# Patient Record
Sex: Female | Born: 1950 | Race: White | Hispanic: No | Marital: Married | Smoking: Never smoker
Health system: Southern US, Community
[De-identification: ages and names within clinical notes are randomized; demographics above are authoritative.]

## PROBLEM LIST (undated history)

## (undated) DIAGNOSIS — F419 Anxiety disorder, unspecified: Secondary | ICD-10-CM

## (undated) DIAGNOSIS — I428 Other cardiomyopathies: Secondary | ICD-10-CM

## (undated) DIAGNOSIS — D219 Benign neoplasm of connective and other soft tissue, unspecified: Secondary | ICD-10-CM

## (undated) DIAGNOSIS — I10 Essential (primary) hypertension: Secondary | ICD-10-CM

## (undated) DIAGNOSIS — R55 Syncope and collapse: Secondary | ICD-10-CM

## (undated) DIAGNOSIS — Z950 Presence of cardiac pacemaker: Secondary | ICD-10-CM

## (undated) DIAGNOSIS — N938 Other specified abnormal uterine and vaginal bleeding: Secondary | ICD-10-CM

## (undated) DIAGNOSIS — H269 Unspecified cataract: Secondary | ICD-10-CM

## (undated) DIAGNOSIS — I442 Atrioventricular block, complete: Secondary | ICD-10-CM

## (undated) HISTORY — DX: Anxiety disorder, unspecified: F41.9

## (undated) HISTORY — DX: Other specified abnormal uterine and vaginal bleeding: N93.8

## (undated) HISTORY — DX: Unspecified cataract: H26.9

## (undated) HISTORY — DX: Atrioventricular block, complete: I44.2

## (undated) HISTORY — DX: Syncope and collapse: R55

## (undated) HISTORY — DX: Benign neoplasm of connective and other soft tissue, unspecified: D21.9

## (undated) HISTORY — DX: Other cardiomyopathies: I42.8

## (undated) HISTORY — DX: Presence of cardiac pacemaker: Z95.0

## (undated) HISTORY — PX: INSERT / REPLACE / REMOVE PACEMAKER: SUR710

---

## 2000-10-22 ENCOUNTER — Other Ambulatory Visit: Admission: RE | Admit: 2000-10-22 | Discharge: 2000-10-22 | Payer: Self-pay | Admitting: Gynecology

## 2000-11-03 ENCOUNTER — Other Ambulatory Visit: Admission: RE | Admit: 2000-11-03 | Discharge: 2000-11-03 | Payer: Self-pay | Admitting: Obstetrics and Gynecology

## 2000-11-03 ENCOUNTER — Encounter (INDEPENDENT_AMBULATORY_CARE_PROVIDER_SITE_OTHER): Payer: Self-pay

## 2001-05-22 ENCOUNTER — Ambulatory Visit (HOSPITAL_COMMUNITY): Admission: RE | Admit: 2001-05-22 | Discharge: 2001-05-22 | Payer: Self-pay | Admitting: Obstetrics and Gynecology

## 2001-07-06 ENCOUNTER — Inpatient Hospital Stay (HOSPITAL_COMMUNITY): Admission: RE | Admit: 2001-07-06 | Discharge: 2001-07-07 | Payer: Self-pay | Admitting: Obstetrics and Gynecology

## 2001-07-06 ENCOUNTER — Encounter (INDEPENDENT_AMBULATORY_CARE_PROVIDER_SITE_OTHER): Payer: Self-pay | Admitting: Specialist

## 2001-07-06 HISTORY — PX: VAGINAL HYSTERECTOMY: SUR661

## 2003-01-03 ENCOUNTER — Other Ambulatory Visit: Admission: RE | Admit: 2003-01-03 | Discharge: 2003-01-03 | Payer: Self-pay | Admitting: Obstetrics and Gynecology

## 2004-01-06 ENCOUNTER — Other Ambulatory Visit: Admission: RE | Admit: 2004-01-06 | Discharge: 2004-01-06 | Payer: Self-pay | Admitting: Obstetrics and Gynecology

## 2005-01-21 ENCOUNTER — Other Ambulatory Visit: Admission: RE | Admit: 2005-01-21 | Discharge: 2005-01-21 | Payer: Self-pay | Admitting: Obstetrics and Gynecology

## 2005-10-28 HISTORY — PX: COLONOSCOPY: SHX174

## 2006-01-22 ENCOUNTER — Other Ambulatory Visit: Admission: RE | Admit: 2006-01-22 | Discharge: 2006-01-22 | Payer: Self-pay | Admitting: Obstetrics and Gynecology

## 2006-02-17 ENCOUNTER — Ambulatory Visit: Payer: Self-pay | Admitting: Gastroenterology

## 2006-03-03 ENCOUNTER — Ambulatory Visit: Payer: Self-pay | Admitting: Gastroenterology

## 2007-02-04 ENCOUNTER — Other Ambulatory Visit: Admission: RE | Admit: 2007-02-04 | Discharge: 2007-02-04 | Payer: Self-pay | Admitting: Obstetrics and Gynecology

## 2008-02-11 ENCOUNTER — Other Ambulatory Visit: Admission: RE | Admit: 2008-02-11 | Discharge: 2008-02-11 | Payer: Self-pay | Admitting: Obstetrics and Gynecology

## 2008-10-28 HISTORY — PX: PACEMAKER INSERTION: SHX728

## 2009-02-13 ENCOUNTER — Encounter: Payer: Self-pay | Admitting: Obstetrics and Gynecology

## 2009-02-13 ENCOUNTER — Other Ambulatory Visit: Admission: RE | Admit: 2009-02-13 | Discharge: 2009-02-13 | Payer: Self-pay | Admitting: Obstetrics and Gynecology

## 2009-02-13 ENCOUNTER — Ambulatory Visit: Payer: Self-pay | Admitting: Obstetrics and Gynecology

## 2009-04-26 ENCOUNTER — Ambulatory Visit: Payer: Self-pay | Admitting: Internal Medicine

## 2009-04-26 ENCOUNTER — Inpatient Hospital Stay (HOSPITAL_COMMUNITY): Admission: EM | Admit: 2009-04-26 | Discharge: 2009-04-29 | Payer: Self-pay | Admitting: Emergency Medicine

## 2009-04-27 ENCOUNTER — Encounter (INDEPENDENT_AMBULATORY_CARE_PROVIDER_SITE_OTHER): Payer: Self-pay | Admitting: Cardiovascular Disease

## 2009-04-28 ENCOUNTER — Encounter: Payer: Self-pay | Admitting: Internal Medicine

## 2009-04-29 ENCOUNTER — Encounter: Payer: Self-pay | Admitting: Internal Medicine

## 2009-05-02 ENCOUNTER — Telehealth: Payer: Self-pay | Admitting: Internal Medicine

## 2009-05-03 ENCOUNTER — Encounter: Payer: Self-pay | Admitting: Internal Medicine

## 2009-05-11 ENCOUNTER — Ambulatory Visit: Payer: Self-pay

## 2009-05-11 ENCOUNTER — Encounter: Payer: Self-pay | Admitting: Internal Medicine

## 2009-05-22 ENCOUNTER — Telehealth: Payer: Self-pay | Admitting: Internal Medicine

## 2009-08-02 ENCOUNTER — Ambulatory Visit: Payer: Self-pay | Admitting: Internal Medicine

## 2009-08-02 DIAGNOSIS — R55 Syncope and collapse: Secondary | ICD-10-CM | POA: Insufficient documentation

## 2009-08-02 DIAGNOSIS — F411 Generalized anxiety disorder: Secondary | ICD-10-CM | POA: Insufficient documentation

## 2009-08-02 DIAGNOSIS — I442 Atrioventricular block, complete: Secondary | ICD-10-CM | POA: Insufficient documentation

## 2009-08-02 DIAGNOSIS — I428 Other cardiomyopathies: Secondary | ICD-10-CM | POA: Insufficient documentation

## 2009-08-03 ENCOUNTER — Telehealth: Payer: Self-pay | Admitting: Internal Medicine

## 2009-09-05 ENCOUNTER — Ambulatory Visit: Payer: Self-pay | Admitting: Cardiology

## 2009-10-04 ENCOUNTER — Ambulatory Visit: Payer: Self-pay | Admitting: Cardiology

## 2009-11-14 ENCOUNTER — Telehealth: Payer: Self-pay | Admitting: Internal Medicine

## 2009-12-04 ENCOUNTER — Telehealth: Payer: Self-pay | Admitting: Internal Medicine

## 2010-02-14 ENCOUNTER — Other Ambulatory Visit: Admission: RE | Admit: 2010-02-14 | Discharge: 2010-02-14 | Payer: Self-pay | Admitting: Obstetrics and Gynecology

## 2010-02-14 ENCOUNTER — Ambulatory Visit: Payer: Self-pay | Admitting: Obstetrics and Gynecology

## 2010-02-19 ENCOUNTER — Telehealth (INDEPENDENT_AMBULATORY_CARE_PROVIDER_SITE_OTHER): Payer: Self-pay | Admitting: *Deleted

## 2010-07-03 ENCOUNTER — Ambulatory Visit: Payer: Self-pay | Admitting: Internal Medicine

## 2010-07-09 ENCOUNTER — Telehealth: Payer: Self-pay | Admitting: Internal Medicine

## 2010-07-27 ENCOUNTER — Encounter: Payer: Self-pay | Admitting: Internal Medicine

## 2010-07-27 ENCOUNTER — Ambulatory Visit: Payer: Self-pay | Admitting: Internal Medicine

## 2010-07-27 ENCOUNTER — Ambulatory Visit (HOSPITAL_COMMUNITY): Admission: RE | Admit: 2010-07-27 | Discharge: 2010-07-27 | Payer: Self-pay | Admitting: Internal Medicine

## 2010-07-30 LAB — CONVERTED CEMR LAB
CO2: 25 meq/L (ref 19–32)
Calcium: 8.7 mg/dL (ref 8.4–10.5)
Chloride: 106 meq/L (ref 96–112)
Glucose, Bld: 106 mg/dL — ABNORMAL HIGH (ref 70–99)
Sodium: 141 meq/L (ref 135–145)

## 2010-08-02 ENCOUNTER — Telehealth: Payer: Self-pay | Admitting: Internal Medicine

## 2010-08-08 ENCOUNTER — Telehealth: Payer: Self-pay | Admitting: Internal Medicine

## 2010-08-17 ENCOUNTER — Telehealth: Payer: Self-pay | Admitting: Internal Medicine

## 2010-08-22 ENCOUNTER — Telehealth: Payer: Self-pay | Admitting: Internal Medicine

## 2010-11-21 ENCOUNTER — Telehealth (INDEPENDENT_AMBULATORY_CARE_PROVIDER_SITE_OTHER): Payer: Self-pay | Admitting: *Deleted

## 2010-11-27 NOTE — Progress Notes (Signed)
Summary: [facility] [place]** dentist appt  Phone Note Call from Patient Call back at [phone]   Caller: Patient Reason for Call: Talk to Nurse Summary of Call: has dentist appt on saturday, do she need to be premedicated? Initial call taken by: Migdalia Dk,  December 04, 2009 2:51 PM  Follow-up for Phone Call        Her dentist is not sure if she needs pre-medication and which drugs he can give her and which ones he cannot with her pacemaker. I will s/w Dr. Graciela Husbands tomorrow.  Follow-up by: Duncan Dull, RN, BSN,  December 04, 2009 4:49 PM  Additional Follow-up for Phone Call Additional follow up Details #1::        antibiotics are not needed for dental worki with a pacemaker Additional Follow-up by: Nathen May, MD, [facility],  December 05, 2009 4:14 PM

## 2010-11-27 NOTE — Progress Notes (Signed)
Summary: question about labs  Phone Note Call from Patient Call back at Work Phone [phone] Call back at [phone]   Summary of Call: Pt have question about labs Initial call taken by: Judie Grieve,  August 02, 2010 2:03 PM  Follow-up for Phone Call        08/02/10--1445pm--pt calling asking why she was put on crestor and does she need to continue--advised that last lipid panel drawn 04/27/09 at [place]--results showed elevated LDL and i explained that was probably reason she was put on crestor--she states she thinks it needs to be drawn again and i advised her to go to PCP and have results faxed to dr klein--pt agrees--nt Follow-up by: Ledon Snare, RN,  August 02, 2010 2:52 PM     Appended Document: question about labs Spoke to patient 10/6 at 2pm. She was requesting copies of lab work from the physician (not [place])  that placed her on Crestor. She agreed to pursue her PCP for the results.

## 2010-11-27 NOTE — Progress Notes (Signed)
Summary: why should she take asa/ [facility] for call back/jr  Phone Note Call from Patient Call back at Home Phone [phone] Call back at [phone]   Caller: Patient Reason for Call: Talk to Nurse Details for Reason: pt wants to know why she's taken asa. 81 mg.  Initial call taken by: Lorne Skeens,  August 22, 2010 8:16 AM  Follow-up for Phone Call        [facility] for call back at home phone. Cell phone not in working order...can't leave a message.  Layne Benton, RN, BSN  August 22, 2010 8:50 AM  pt returned call-questioning if she is to continue antric aspirin, she has to pay this out of pocket, nad not sure that she needs it-pls call [phone] Glynda Jaeger  August 22, 2010 9:04 AM   Additional Follow-up for Phone Call Additional follow up Details #1::        Called patient back again and advised that we will ask Dr.Klein tomorrow if she needs to stay on Asa 81 mg and call her back.  Layne Benton, RN, BSN  August 22, 2010 11:22 AM  pt returning call Judie Grieve  August 23, 2010 8:53 AM    Additional Follow-up for Phone Call Additional follow up Details #2::    Discuused with Dr Graciela Husbands about 81mg  ASA and there is no benefit for her with NICM in taking the 81mg  ASA.  Pt aware and will stop ASA81mg  daily and she is also working on her diet. Dennis Bast, RN, BSN  August 23, 2010 10:55 AM

## 2010-11-27 NOTE — Cardiovascular Report (Signed)
Summary: Office Visit   Office Visit   Imported By: Roderic Ovens 07/10/2010 11:27:10  _____________________________________________________________________  External Attachment:    Type:   Image     Comment:   External Document

## 2010-11-27 NOTE — Progress Notes (Signed)
Summary: QUESTION HOW TO LOWER LDL  Phone Note Call from Patient Call back at [phone]   Caller: Patient Reason for Call: Talk to Nurse Summary of Call: QUESTION WHAT TO EAT AND TO TAKE TO LOWER LDL Initial call taken by: Roe Coombs,  August 08, 2010 10:35 AM  Follow-up for Phone Call        discussed options w/pt to lower cholesterol and will send menu options to pt to assist w/lower cholesterol.  Follow-up by: Claris Gladden RN,  August 08, 2010 12:27 PM

## 2010-11-27 NOTE — Progress Notes (Signed)
  ROI Signed....02/15/2010   Appended Document:  Faxed last 4 Office Notes over to [facility] to [employer] [phone]

## 2010-11-27 NOTE — Progress Notes (Signed)
Summary: refill  Phone Note Refill Request Call back at [phone] Message from:  Patient  Refills Requested: Medication #1:  RAMIPRIL 2.5 MG CAPS one by mouth daily   Supply Requested: 3 months  Medication #2:  CRESTOR 10 MG TABS Take one tablet by mouth daily.   Supply Requested: 3 months  Medication #3:  CARVEDILOL 12.5 MG TABS Take one tablet by mouth twice a day   [facility]ed[facility] [address] Needs 90 day supply   Method Requested: Fax to Local Pharmacy Initial call taken by: Migdalia Dk,  November 14, 2009 9:37 AM  Follow-up for Phone Call        pt wanted to talk to someone about the refills..she has already called pharm. Omer Jack  November 14, 2009 9:56 AM  please call pt once done, Migdalia Dk  November 14, 2009 10:43 AM    Additional Follow-up for Phone Call Additional follow up Details #1::        [facility] [address]  Additional Follow-up by: Oswald Hillock,  November 14, 2009 10:56 AM    Prescriptions: CARVEDILOL 12.5 MG TABS (CARVEDILOL) Take one tablet by mouth twice a day  #180 x 1   Entered by:   Oswald Hillock   Authorized by:   Nathen May, MD, [facility]   Signed by:   Oswald Hillock on 11/14/2009   Method used:   F[facility]ed to ...       [id number]       [address]       [place], [place]  [address]       Ph: [phone] or [phone]       Fax: [phone]   RxID:   [id number] CRESTOR 10 MG TABS (ROSUVASTATIN CALCIUM) Take one tablet by mouth daily.  #90 x 3   Entered by:   Judithe Modest CMA   Authorized by:   Nathen May, MD, [facility]   Signed by:   Judithe Modest CMA on 11/14/2009   Method used:   Electronically to        [facility] [phone]* (retail)       [address]       [place], [place]  [address]       Ph: [phone] or [phone]       Fax: [phone]   RxID:   [id number] CARVEDILOL 12.5 MG TABS (CARVEDILOL) Take one tablet by mouth twice a day   #180 x 3   Entered by:   Judithe Modest CMA   Authorized by:   Nathen May, MD, [facility]   Signed by:   Judithe Modest CMA on 11/14/2009   Method used:   Electronically to        [facility] [phone]* (retail)       [address]       [place], [place]  [address]       Ph: [phone] or [phone]       Fax: [phone]   RxID:   [id number] RAMIPRIL 2.5 MG CAPS (RAMIPRIL) one by mouth daily  #90 x 3   Entered by:   Judithe Modest CMA   Authorized by:   Nathen May, MD, [facility]   Signed by:   Judithe Modest CMA on 11/14/2009   Method used:   Electronically to        [facility]  [address] [phone]* (retail)       [address]  [address]       [place], [place]  [address]       Ph: [phone] or [phone]       Fax: [phone]   RxID:   [id number]

## 2010-11-27 NOTE — Progress Notes (Signed)
Summary: refill request  Phone Note Refill Request Message from:  Patient on August 17, 2010 9:13 AM  Refills Requested: Medication #1:  RAMIPRIL 5 MG CAPS one cap daily pt wants 90 day supply[facility] [address]-pt req a call to let her know if we can call in the 90 day supply [phone]   Method Requested: Telephone to Pharmacy Initial call taken by: Glynda Jaeger,  August 17, 2010 9:14 AM  Follow-up for Phone Call       Follow-up by: Judithe Modest CMA,  August 17, 2010 9:58 AM    Prescriptions: RAMIPRIL 5 MG CAPS (RAMIPRIL) one cap daily  #90 x 3   Entered by:   Judithe Modest CMA   Authorized by:   Nathen May, MD, [facility]   Signed by:   Judithe Modest CMA on 08/17/2010   Method used:   Electronically to        [facility] [phone]* (retail)       [address]       [place], [place]  [address]       Ph: [phone] or [phone]       Fax: [phone]   RxID:   [id number]

## 2010-11-27 NOTE — Progress Notes (Signed)
Summary: question about cost of echo  Phone Note Call from Patient Call back at Home Phone [phone] Call back at Work Phone [phone]   Caller: Patient Reason for Call: Talk to Nurse Details for Reason: pt calling asking for a quote on test - echo . spoke with Lovina Reach at front desk , was told between 1500-1900. pt would like for nurse to call her  has question.  Initial call taken by: Lorne Skeens,  July 09, 2010 9:26 AM  Follow-up for Phone Call        PER [facility] ECHO COSTS $1587.00 THAT INCLUDES TEST AND  THE READING OF TEST. PT AWARE WILL CALL INSURANCE  TO SEE HOW  MUCH INS WILL COVER PER PT IF WILL BE OUT OF POCKET ALOT OF MONEY MAY HAVE TO CANCEL TEST WILL CALL  BACK. Follow-up by: Scherrie Bateman, LPN,  July 09, 2010 11:35 AM

## 2010-11-27 NOTE — Assessment & Plan Note (Signed)
Summary: PC2   CC:  pc2.  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 Cardiac catheterization demonstrated nonobstructive coronary disease. Ejection fraction on that was 20-25%. Repeat ultrasound showed EF of 35-40%. She had pacemaker placed,;initiation of carvediloll and ramipril  have been associated with improvement in energy  T   Current Medications (verified): 1)  Ramipril 2.5 Mg Caps (Ramipril) .... One By Mouth Daily 2)  Carvedilol 12.5 Mg Tabs (Carvedilol) .... Take One Tablet By Mouth Twice A Day 3)  Aspir-Low 81 Mg Tbec (Aspirin) .... One By Mouth Daily 4)  [facility] Spect[facility]ite  Tabs (Multiple Vitamins-Minerals) .... One By Mouth Daily 5)  Crestor 10 Mg Tabs (Rosuvastatin Calcium) .... Take One Tablet By Mouth Daily. 6)  Calcium Carbonate 600 Mg Tabs (Calcium Carbonate) .... Take One Two Times A Day  Allergies (verified): 1)  * Prednisone  Past History:  Past Medical History: Last updated: 08/02/2009 CAD, NATIVE VESSEL (ICD-414.01) PACEMAKER, PERMANENT (ICD-V45.01) ATRIOVENTRICULAR BLOCK, 3RD DEGREE (ICD-426.0) RIGHT BUNDLE BRANCH BLOCK (ICD-426.4) SYNCOPE (ICD-780.2) ANXIETY (ICD-300.00)    Past Surgical History: Last updated: 08/02/2009  Contrast venogram and dual-chamber pacemaker implantation.Marland KitchenMarland KitchenMedtronic 5076 active fixation   ventricular lead, serial number PJ [id number] and an atrial lead 5076,   serial number PJ [id number].  Hysterectomy  Family History: Last updated: 08/02/2009  [name] is diabetic and mother is hypertensive.  Social History: Last updated: 08/02/2009 Tobacco Use - No.  Alcohol Use - no Drug Use - no  Vital Signs:  Patient profile:   60 year old female Height:      65 inches Weight:      108 pounds BMI:     18.04 Pulse rate:   67 / minute Pulse rhythm:   regular BP sitting:   130 / 84  (right arm) Cuff size:   regular  Vitals Entered By: Judithe Modest CMA (July 03, 2010 12:29 PM)  Physical Exam  General:  The patient was alert and oriented in no acute distress. HEENT Normal.  Neck veins were flat, carotids were brisk.  Lungs were clear.  Heart sounds were regular without murmurs or gallops.  Abdomen was soft with active bowel sounds. There is no clubbing cyanosis or edema. Skin Warm and dry    PPM Specifications Following MD:  Sherryl Manges, MD     PPM Vendor:  Medtronic     PPM Model Number:  ADDRL1     PPM Serial Number:  [id number] PPM DOI:  04/27/2009     PPM Implanting MD:  Sherryl Manges, MD  Lead 1    Location: RA     DOI: 04/27/2009     Model #: [id number]     Serial #: [id number]     Status: active Lead 2    Location: RV     DOI: 04/27/2009     Model #: [id number]     Serial #: [id number]     Status: active  Magnet Response Rate:  BOL 85 ERI  65  Indications:  CHB   PPM Follow Up Battery Voltage:  2.78 V     Battery Est. Longevity:  10.5 YRS     Pacer Dependent:  Yes       PPM Device Measurements Atrium  Amplitude: 0.70 mV, Impedance: 598 ohms, Threshold: 0.50 V at 0.40 msec Right Ventricle  Amplitude: PACED mV, Impedance: 503 ohms, Threshold: 0.750 V at 0.40 msec  Episodes MS Episodes:  0     Coumadin:  No Ventricular High Rate:  0     Atrial Pacing:  26.5%     Ventricular Pacing:  99.9%  Parameters Mode:  DDD     Lower Rate Limit:  60     Upper Rate Limit:  130 Paced AV Delay:  180     Sensed AV Delay:  150 Next Cardiology Appt Due:  12/27/2010 Tech Comments:  NORMAL DEVICE FUNCTION.  NO EPISODES SINCE LAST CHECK.  NO CHANGES MADE. ROV IN 6 MTHS W/DEVICE CLINIC AND 12 MTHS W/SK. Vella Kohler  July 03, 2010 12:39 PM  Impression & Recommendations:  Problem # 1:  CARDIOMYOPATHY, DILATED (ICD-425.4)  ejection fraction was 25-35% up to 40% last year. Her improvement in symptoms suggestive there is then further interval improvement in LV function. We will repeat an echo. All have her increase her rather  profound 2.5-5 mg a day. Her blood pressures early adequate 140 mm Her updated medication list for this problem includes:    Ramipril 5 Mg Caps (Ramipril) ..... One cap daily    Carvedilol 12.5 Mg Tabs (Carvedilol) .Marland Kitchen... Take one tablet by mouth twice a day    Aspir-low 81 Mg Tbec (Aspirin) ..... One by mouth daily  Orders: Echocardiogram (Echo)  Problem # 2:  ATRIOVENTRICULAR BLOCK, 3RD DEGREE (ICD-426.0) device dependent Her updated medication list for this problem includes:    Ramipril 5 Mg Caps (Ramipril) ..... One cap daily    Carvedilol 12.5 Mg Tabs (Carvedilol) .Marland Kitchen... Take one tablet by mouth twice a day    Aspir-low 81 Mg Tbec (Aspirin) ..... One by mouth daily  Problem # 3:  PACEMAKER,MDT DDD (ICD-V45.01) Device parameters and data were reviewed and no changes were made  Problem # 4:  SYNCOPE (ICD-780.2) no recurrent syncope Her updated medication list for this problem includes:    Ramipril 5 Mg Caps (Ramipril) ..... One cap daily    Carvedilol 12.5 Mg Tabs (Carvedilol) .Marland Kitchen... Take one tablet by mouth twice a day    Aspir-low 81 Mg Tbec (Aspirin) ..... One by mouth daily  Patient Instructions: 1)  Your physician has recommended you make the following change in your medication: INCREASE RAMIPRIL TO 5MG  A DAY.  2)  Your physician wants you to follow-up in: 6 MONTHS  You will receive a reminder letter in the mail two months in advance. If you don't receive a letter, please call our office to schedule the follow-up appointment. 3)  Your physician recommends that you return for lab work in: 2 WEEKS FOR BMET. 4)  Your physician has requested that you have an echocardiogram IN 2 WEEKS.  Echocardiography is a painless test that uses sound waves to create images of your heart. It provides your doctor with information about the size and shape of your heart and how well your heart's chambers and valves are working.  This procedure takes approximately one hour. There are no restrictions for  this procedure. Prescriptions: RAMIPRIL 5 MG CAPS (RAMIPRIL) one cap daily  #30 x 11   Entered by:   Claris Gladden RN   Authorized by:   Nathen May, MD, [facility]   Signed by:   Claris Gladden RN on 07/03/2010   Method used:   [facility]ron[facility] to        [facility] [phone][facility] (retail)       [address]       [place], [place]  [address]  Ph: [phone] or [phone]       Fax: [phone]   RxID:   [id number]

## 2010-11-29 NOTE — Progress Notes (Signed)
Summary: re cholesterol  Phone Note Call from Patient Call back at [phone]   Caller: Patient Reason for Call: Talk to Nurse Summary of Call: pt wants to know what kind of food she needs to eat to lower her cholesterol  Initial call taken by: Roe Coombs,  November 21, 2010 10:20 AM  Follow-up for Phone Call        Pt called to discuss yogurt options.  She prefers it to ice- cream.  Discussed diet and cholesterol with pt.  Follow-up by: Lisabeth Devoid RN,  November 21, 2010 3:56 PM

## 2010-12-25 ENCOUNTER — Telehealth: Payer: Self-pay | Admitting: Internal Medicine

## 2010-12-27 ENCOUNTER — Other Ambulatory Visit: Payer: Self-pay | Admitting: Internal Medicine

## 2010-12-27 ENCOUNTER — Encounter: Payer: Self-pay | Admitting: Internal Medicine

## 2010-12-27 ENCOUNTER — Encounter (INDEPENDENT_AMBULATORY_CARE_PROVIDER_SITE_OTHER): Payer: BC Managed Care – PPO

## 2010-12-27 ENCOUNTER — Other Ambulatory Visit (INDEPENDENT_AMBULATORY_CARE_PROVIDER_SITE_OTHER): Payer: BC Managed Care – PPO

## 2010-12-27 ENCOUNTER — Telehealth: Payer: Self-pay | Admitting: Internal Medicine

## 2010-12-27 DIAGNOSIS — E785 Hyperlipidemia, unspecified: Secondary | ICD-10-CM

## 2010-12-27 DIAGNOSIS — I498 Other specified cardiac arrhythmias: Secondary | ICD-10-CM

## 2010-12-27 DIAGNOSIS — Z79899 Other long term (current) drug therapy: Secondary | ICD-10-CM

## 2010-12-27 LAB — HEPATIC FUNCTION PANEL
ALT: 22 U/L (ref 0–35)
Bilirubin, Direct: 0.1 mg/dL (ref 0.0–0.3)
Total Bilirubin: 0.8 mg/dL (ref 0.3–1.2)

## 2010-12-27 LAB — LIPID PANEL
HDL: 58.8 mg/dL (ref >39.00)
LDL Cholesterol: 74 mg/dL (ref 0–99)
Total CHOL/HDL Ratio: 2
VLDL: 7.8 mg/dL (ref 0.0–40.0)

## 2011-01-01 ENCOUNTER — Encounter: Payer: Self-pay | Admitting: Internal Medicine

## 2011-01-03 NOTE — Progress Notes (Signed)
Summary: question re meds  Phone Note Call from Patient Call back at Home Phone [phone] Call back at cell [phone]   Caller: Patient Reason for Call: Talk to Nurse Summary of Call: pt wants know if she still needs to be on crestor.  Initial call taken by: Roe Coombs,  December 25, 2010 3:15 PM  Follow-up for Phone Call         [facility] Scherrie Bateman, LPN  December 25, 2010 5:56 PM   PT AWARE TO CONT CRESTOR AND THE NEED TO HAVE LIPID LIVER DONE WILL DO  LABS  ON 12/27/10 AT 8:30 . Follow-up by: Scherrie Bateman, LPN,  December 26, 2010 8:54 AM

## 2011-01-03 NOTE — Progress Notes (Addendum)
Summary: lab results  Phone Note Call from Patient Call back at [phone]   Reason for Call: Lab or Test Results Initial call taken by: Judie Grieve,  December 27, 2010 1:11 PM  Follow-up for Phone Call        Pt aware Dr. Graciela Husbands has not reviewed labs.  Follow-up by: Lisabeth Devoid RN,  December 27, 2010 3:28 PM     Appended Document: lab results PT AWARE OF LAB RESULTS./CY

## 2011-01-08 NOTE — Letter (Signed)
Summary: Custom - Lipid  [facility], Main Office  [address]   [place], [place] [address]   Phone: [phone]  Fax: [phone]     January 01, 2011 MRN: [id number]   Melissa [name] [address] [place], [place]  [address]   Dear Ms. Early,  We have reviewed your cholesterol results.  They are as follows:     Total Cholesterol:    141 (Desirable: less than 200)       HDL  Cholesterol:     58.80  (Desirable: greater than 40 for men and 50 for women)       LDL Cholesterol:       74  (Desirable: less than 100 for low risk and less than 70 for moderate to high risk)       Triglycerides:       39.0  (Desirable: less than 150)  Our recommendations include: VALUES ARE TERRIFIC PER DR Graciela Husbands  NO CHANGES   Call our office at the number listed above if you have any questions.  Lowering your LDL cholesterol is important, but it is only one of a large number of "risk factors" that may indicate that you are at risk for heart disease, stroke or other complications of hardening of the arteries.  Other risk factors include:   A.  Cigarette Smoking* B.  High Blood Pressure* C.  Obesity* D.   Low HDL Cholesterol (see yours above)* E.   Diabetes Mellitus (higher risk if your is uncontrolled) F.  Family history of premature heart disease G.  Previous history of stroke or cardiovascular disease    *These are risk factors YOU HAVE CONTROL OVER.  For more information, visit .  There is now evidence that lowering the TOTAL CHOLESTEROL AND LDL CHOLESTEROL can reduce the risk of heart disease.  The American Heart Association recommends the following guidelines for the treatment of elevated cholesterol:  1.  If there is now current heart disease and less than two risk factors, TOTAL CHOLESTEROL should be less than 200 and LDL CHOLESTEROL should be less than 100. 2.  If there is current heart disease or two or more risk factors, TOTAL CHOLESTEROL should be less than 200 and  LDL CHOLESTEROL should be less than 70.  A diet low in cholesterol, saturated fat, and calories is the cornerstone of treatment for elevated cholesterol.  Cessation of smoking and exercise are also important in the management of elevated cholesterol and preventing vascular disease.  Studies have shown that 30 to 60 minutes of physical activity most days can help lower blood pressure, lower cholesterol, and keep your weight at a healthy level.  Drug therapy is used when cholesterol levels do not respond to therapeutic lifestyle changes (smoking cessation, diet, and exercise) and remains unacceptably high.  If medication is started, it is important to have you levels checked periodically to evaluate the need for further treatment options.  Thank you,    [employer] Team

## 2011-01-08 NOTE — Procedures (Signed)
Summary: pacer check/medtronic  mca   Current Medications (verified): 1)  Ramipril 5 Mg Caps (Ramipril) .... One Cap Daily 2)  Carvedilol 12.5 Mg Tabs (Carvedilol) .... Take One Tablet By Mouth Twice A Day 3)  [facility] Spect[facility]  Tabs (Multiple Vitamins-Minerals) .... One By Mouth Daily 4)  Crestor 10 Mg Tabs (Rosuvastatin Calcium) .... Take One Tablet By Mouth Daily. 5)  Calcium Carbonate 600 Mg Tabs (Calcium Carbonate) .... Take One Two Times A Day  Allergies (verified): 1)  * Prednisone  PPM Specifications Following MD:  Sherryl Manges, MD     PPM Vendor:  Medtronic     PPM Model Number:  [id number]     PPM Serial Number:  [id number] PPM DOI:  04/27/2009     PPM Implanting MD:  Sherryl Manges, MD  Lead 1    Location: RA     DOI: 04/27/2009     Model #: 9147     Serial #: [id number]     Status: active Lead 2    Location: RV     DOI: 04/27/2009     Model #: 6578     Serial #: [id number]     Status: active  Magnet Response Rate:  BOL 85 ERI  65  Indications:  CHB   PPM Follow Up Pacer Dependent:  Yes      Episodes Coumadin:  No  Parameters Mode:  DDD     Lower Rate Limit:  60     Upper Rate Limit:  130 Paced AV Delay:  180     Sensed AV Delay:  150 Tech Comments:  See Pace Art

## 2011-01-15 NOTE — Cardiovascular Report (Signed)
Summary: Office Visit   Office Visit   Imported By: Roderic Ovens 01/11/2011 16:09:39  _____________________________________________________________________  External Attachment:    Type:   Image     Comment:   External Document

## 2011-02-03 LAB — LIPID PANEL
Cholesterol: 194 mg/dL (ref 0–200)
HDL: 61 mg/dL (ref >39)
LDL Cholesterol: 123 mg/dL — ABNORMAL HIGH (ref 0–99)
Total CHOL/HDL Ratio: 3.2 RATIO
Triglycerides: 50 mg/dL (ref <150)
VLDL: 10 mg/dL (ref 0–40)

## 2011-02-03 LAB — CBC
HCT: 44.8 % (ref 36.0–46.0)
Platelets: 155 10*3/uL (ref 150–400)
RDW: 13.5 % (ref 11.5–15.5)
WBC: 6.8 10*3/uL (ref 4.0–10.5)

## 2011-02-03 LAB — BASIC METABOLIC PANEL
BUN: 12 mg/dL (ref 6–23)
Calcium: 8.8 mg/dL (ref 8.4–10.5)
Creatinine, Ser: 0.82 mg/dL (ref 0.4–1.2)
GFR calc non Af Amer: >60 mL/min (ref >60)
Glucose, Bld: 104 mg/dL — ABNORMAL HIGH (ref 70–99)
Potassium: 3.8 mEq/L (ref 3.5–5.1)

## 2011-02-03 LAB — TROPONIN I: Troponin I: 0.37 ng/mL — ABNORMAL HIGH (ref 0.00–0.06)

## 2011-02-03 LAB — CK TOTAL AND CKMB (NOT AT ARMC)
CK, MB: 3.3 ng/mL (ref 0.3–4.0)
Relative Index: INVALID (ref 0.0–2.5)

## 2011-02-03 LAB — ROCKY MTN SPOTTED FVR AB, IGG-BLOOD: RMSF IgG: 0.14 IV

## 2011-02-04 LAB — POCT I-STAT, CHEM 8
BUN: 20 mg/dL (ref 6–23)
Calcium, Ion: 1.12 mmol/L (ref 1.12–1.32)
Chloride: 103 mEq/L (ref 96–112)
Glucose, Bld: 103 mg/dL — ABNORMAL HIGH (ref 70–99)
TCO2: 25 mmol/L (ref 0–100)

## 2011-02-04 LAB — CK TOTAL AND CKMB (NOT AT ARMC)
CK, MB: 2.6 ng/mL (ref 0.3–4.0)
CK, MB: 3.2 ng/mL (ref 0.3–4.0)
Relative Index: INVALID (ref 0.0–2.5)
Relative Index: INVALID (ref 0.0–2.5)
Total CK: 41 U/L (ref 7–177)

## 2011-02-04 LAB — SEDIMENTATION RATE: Sed Rate: 2 mm/hr (ref 0–22)

## 2011-02-04 LAB — CBC
HCT: 42.4 % (ref 36.0–46.0)
Hemoglobin: 14.4 g/dL (ref 12.0–15.0)
MCV: 97.1 fL (ref 78.0–100.0)
RDW: 13.4 % (ref 11.5–15.5)

## 2011-02-04 LAB — POCT CARDIAC MARKERS: Troponin i, poc: <0.05 ng/mL (ref 0.00–0.09)

## 2011-02-04 LAB — PROTIME-INR
INR: 1 (ref 0.00–1.49)
Prothrombin Time: 13.9 seconds (ref 11.6–15.2)

## 2011-02-18 ENCOUNTER — Other Ambulatory Visit (HOSPITAL_COMMUNITY)
Admission: RE | Admit: 2011-02-18 | Discharge: 2011-02-18 | Disposition: A | Discharge status: Home / Self Care | Payer: BC Managed Care – PPO | Source: Ambulatory Visit | Attending: Obstetrics and Gynecology | Admitting: Obstetrics and Gynecology

## 2011-02-18 ENCOUNTER — Encounter (INDEPENDENT_AMBULATORY_CARE_PROVIDER_SITE_OTHER): Payer: BC Managed Care – PPO | Admitting: Obstetrics and Gynecology

## 2011-02-18 ENCOUNTER — Other Ambulatory Visit: Payer: Self-pay | Admitting: Obstetrics and Gynecology

## 2011-02-18 DIAGNOSIS — Z124 Encounter for screening for malignant neoplasm of cervix: Secondary | ICD-10-CM | POA: Insufficient documentation

## 2011-02-18 DIAGNOSIS — Z01419 Encounter for gynecological examination (general) (routine) without abnormal findings: Secondary | ICD-10-CM

## 2011-02-18 DIAGNOSIS — R82998 Other abnormal findings in urine: Secondary | ICD-10-CM

## 2011-02-26 ENCOUNTER — Telehealth: Payer: Self-pay | Admitting: Internal Medicine

## 2011-02-26 NOTE — Telephone Encounter (Signed)
Pt on crestor and she rec a letter from her insurance co about her going to a generic pt wants a nurse call @[phone]

## 2011-03-05 DIAGNOSIS — Z1211 Encounter for screening for malignant neoplasm of colon: Secondary | ICD-10-CM

## 2011-03-12 NOTE — Discharge Summary (Signed)
Melissa [name], Melissa [name]              ACCOUNT NO.:  [id number]   MEDICAL RECORD NO.:  [id number]          PATIENT TYPE:  INP   LOCATION:  2013                         FACILITY:  [facility]   PHYSICIAN:  Eduardo Osier. Sharyn Lull, M.D. 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   FINAL DIAGNOSES:  Status post syncope, status post complete heart block  with right bundle-branch block, escape rhythm, status post permanent  pacemaker, nonischemic dilated cardiomyopathy, scoliosis, and  hypercholesteremia.   DISCHARGE HOME MEDICATIONS:  1. Enteric-coated aspirin 81 mg 1 tablet daily.  2. Coreg 3.125 mg 1 tablet every 12 hours.  3. Altace 2.5 mg 1 capsule daily.  4. Crestor 10 mg 1 tablet daily.   DIET:  Low-salt, low-cholesterol.   ACTIVITY:  Increase activity slowly as tolerated.   FOLLOWUP:  Follow up with Dr. Graciela Husbands in 2 weeks and Dr. Algie Coffer in 1  week.   Pacemaker and cardiac catheter instructions have been given.  Follow up  with Dr. Jodelle Green and Dr. Graciela Husbands as above.   CONDITION AT DISCHARGE:  Stable.   BRIEF HISTORY AND HOSPITAL COURSE:  Ms. Sachdeva is 60 year old white  female with past medical history significant for scoliosis.  She came to  ED via the EMS following syncopal episode at work and was referred to me  in complete heart block with right  bundle-branch block escape rhythm.  The patient denied any chest pain or seizure activity.   PAST MEDICAL HISTORY:  No history of hypertension, diabetes.  Positive  history of food intolerance and anxiety.   MEDICATIONS AT HOME:  She is on multivitamin and calcium.   PHYSICAL EXAMINATION:  VITAL SIGNS:  Blood pressure was 158/80, pulse  was 50, temperature was 97.7.  NECK:  Supple.  No JVD.  LUNGS:  Clear to auscultation without  rhonchi or rales.  CARDIOVASCULAR:  S1 and S2 normal.  EXTREMITIES:  There is no clubbing, cyanosis, or edema.  There are  varicose veins noted.   LABORATORY DATA:  Hemoglobin was 14.4, hematocrit 42.4, white count of  5.3.  Her cardiac enzymes were negative.  BNP was 105.  TSH was 3.108  which is in normal range.  Sed rate which is also in the normal range.  Cholesterol was 94, LDL 123, HDL 51, triglycerides 250. ANA was  negative.   BRIEF HOSPITAL COURSE:  The patient was brought to the cath lab and  evaluated definitively, cardiac cath, and placement of temporary  transvenous pacemaker without any problems by Dr. Algie Coffer who found the  patient was on pills.  Subsequently, the patient underwent permanent  pacemaker insertion on July 1, without any problems.  The patient has  been ambulating.  The patient then had 2D echo during the hospital stay  which showed EF of 35-40%, which has not been proved since cardiac cath.  The patient also has CT of the chest today which showed  no acute  abnormalities and CT of the chest, no mediastinal or hilar adenopathy.  There was previous thoracolumbar scoliosis.  The patient has been  ambulating in hallway without any problems.  Her groin is stable.  Her  pacemaker site is stable.  The patient will be discharged home on above  medications and will be followed up by Dr. Algie Coffer and Dr. Graciela Husbands.      Eduardo Osier. Sharyn Lull, M.D.  Electronically Signed     MNH/MEDQ  D:  04/29/2009  T:  04/30/2009  Job:  [id number]   cc:   Ricki Rodriguez, M.D.  Duke Salvia, MD, [facility]

## 2011-03-12 NOTE — Op Note (Signed)
NAMEYARI, SZELIGA NO.:  [id number]   MEDICAL RECORD NO.:  [id number]          PATIENT TYPE:  INP   LOCATION:  2912                         FACILITY:  [facility]   PHYSICIAN:  Duke Salvia, MD, FACCDATE OF BIRTH:  November 02, 1950   DATE OF PROCEDURE:  04/27/2009  DATE OF DISCHARGE:                               OPERATIVE REPORT   PREOPERATIVE DIAGNOSES:  Complete heart block; scoliosis.   POSTOPERATIVE DIAGNOSES:  Complete heart block; scoliosis.   PROCEDURE:  Contrast venogram and dual-chamber pacemaker implantation.   Following obtaining informed consent, the patient was brought to the  electrophysiology laboratory and placed on the fluoroscopic table in a  supine position.  After routine prep and drape of the left upper chest,  lidocaine was infiltrated in prepectoral subclavicular region.  Incision  was made and carried down to the layer of the prepectoral fascia, which  turned out to be not very deep and a pocket was formed similarly.  Hemostasis was obtained.   Thereafter, attention was turned to gain access to the extrathoracic  left subclavian vein, which was mildly difficult.  Because of that and  her tortuous anatomy related to her scoliosis, a contrast venogram  identified the course and patency of the vein that allowed for ready  access to the left subclavian vein.  This having been accomplished, 2  guidewires were placed and retained and sequentially 7-French sheath  were placed through which were passed Medtronic 5076 active fixation  ventricular lead, serial number [id number] and an atrial lead 5076,  serial number [id number].   Under fluoroscopic guidance, these were manipulated to the right  ventricular septum and the right atrial appendage respectively where the  bipolar R-wave was paced from the temporary pacemaker.  The impedance  was 1017 ohms, threshold of 1 volts at 0.5 milliseconds.  Current  threshold of 1.1 mA and the current of injury  was brisk.   The bipolar P-wave was 7 with pace impedance of 1392 ohms, threshold 0.7  volts at 0.5 milliseconds.  Current threshold of 0.8 mA and the current  of injury was also brisk.  There was no diaphragmatic pacing at 10  volts.  The leads were then attached to a Medtronic Adapta L Pulse  Generator, serial number [id number].  Ventricular pacing and P  synchronous pacing were identified.  The pocket was copiously irrigated  with antibiotic containing saline solution.  Hemostasis was assured.  We  then tried a variety of configurations in which to place the device in  the patient's chest because there was, A; fair amount of curvature, and  B various thin subcutaneous tissue.  Having done our best, we then  placed the leads and pulse generator in the pocket secured them to the  prepectoral fascia, closed the wound in 3 layers in normal fashion.  The  wound was washed, dried, and a benzoin Steri-Strip dressing was applied.  Needle counts, sponge counts, and instrument counts were correct at the  end of procedure according to staff.  The patient tolerated the  procedure without apparent complications.  Duke Salvia, MD, [facility]  Electronically Signed    SCK/MEDQ  D:  04/27/2009  T:  04/28/2009  Job:  [id number]

## 2011-03-15 NOTE — Discharge Summary (Signed)
[facility]  Patient:    JESSE, HIRST Visit Number: [id number] MRN: [id number]          Service Type: GYN Location: 4W 0447 01 Attending Physician:  Sharon Mt Dictated by:   Rande Brunt. Eda Paschal, M.D. Admit Date:  07/06/2001                             Discharge Summary  HISTORY OF PRESENT ILLNESS:  The patient is a 60 year old female who was admitted to the hospital with large submucous myoma for a vaginal hysterectomy.  HOSPITAL COURSE:  On the day of admission she was taken to the operating room and a vaginal hysterectomy was performed.  She was kept overnight for observation. By the next morning she was voiding well, tolerating a normal diet, and was ready for discharge.  DISCHARGE MEDICATIONS:  Vicodin for pain.  DIET:  To advance as tolerated.  ACTIVITY:  Ambulatory:  CONDITION ON DISCHARGE:  Improved.  DISCHARGE DIAGNOSES:  Refractory dysfunctional uterine bleeding, submucous leiomyoma.  OPERATION:  Vaginal hysterectomy.  Please note, pathology report not available at time of dictation. Dictated by:   Rande Brunt. Eda Paschal, M.D. Attending Physician:  Sharon Mt DD:  07/07/01 TD:  07/07/01 Job: [id number]

## 2011-03-15 NOTE — Op Note (Signed)
[facility]  Patient:    Melissa [name], Melissa [name] Visit Number: [id number] MRN: [id number]          Service Type: GYN Location: 4W 0447 01 Attending Physician:  Sharon Mt Dictated by:   Rande Brunt. Eda Paschal, M.D. Proc. Date: 07/06/01 Admit Date:  07/06/2001                             Operative Report  PREOPERATIVE DIAGNOSES:  Refractory dysfunctional uterine bleeding with leiomyomata uteri.  POSTOPERATIVE DIAGNOSES:  Refractory dysfunctional uterine bleeding with leiomyomata uteri.  OPERATION PERFORMED:  Vaginal hysterectomy.  SURGEON:  Daniel L. Eda Paschal, M.D.  FIRST ASSISTANT:  Katy Fitch, M.D.  ANESTHESIA:  General endotracheal.  FINDINGS:  The patients uterus was enlarged to 10 plus weeks with multiple myoma. The largest one was a submucosa myoma that was not confined to the cavity of 7 cm. Ovaries, fallopian tubes, and pelvic peritoneum were free of any disease.  DESCRIPTION OF PROCEDURE:  After adequate general endotracheal anesthesia, the patient was placed in the dorsal lithotomy position, prepped and draped in the usual sterile manner. A 1:200,000 solution of epinephrine with 0.5% xylocaine was injected around the cervix. A 360 degree incision was made around the cervix, the bladder was mobilized superiorly as was the posterior peritoneum. Initially because of the myomas and her previous cesarean sections, it was not possible to enter the vesicouterine fold to peritoneum. The cul-de-sac could be entered by sharp dissection without difficulty. The uterosacral ligaments were clamped. They were then sutured to the vault for good support shortening them with #1 chromic catgut. The cardinal ligaments and uterine arteries were clamped, cut and suture ligated with #1 chromic catgut with the uterine arteries being bilaterally ligated. The uterus was then partially delivered. An incision was made in the posterior surface of the  uterus until the myomas were encountered. Traction was applied to the myomas. Myomectomies were then performed. Finally doing this and removing myomas in pieces, the entire uterus could be delivered. During this part of the procedure because of concern about the bladder, a Foley catheter was inserted which drained clear urine. After the uterus had been delivered, the surgeon put his finger in the cul-de-sac to identify the vesicouterine fold to peritoneum and that was sharply incised without injuring the bladder. The bladder of the broad ligament, utero-ovarian ligaments, round ligaments and fallopian tubes were clamped, cut, and doubly suture ligated with #1 chromic catgut. Copious irrigation was done with Ringers lactate. Two sponge, needle and instrument counts were correct. The peritoneum and the cuff were closed with two running locking #0 Vicryl. A cul-de-sac of 2-0 Vicryl was placed to eliminate the cul-de-sac to prevent an enterocele incorporating the uterosacral ligaments in the posterior peritoneum. The cuff and peritoneum were then closed with figure-of-eights of #1 chromic catgut. Estimated blood loss for the entire procedure was 200 cc with none replaced. The patient tolerated the procedure well and left the operating room in satisfactory condition draining clear urine from her Foley catheter. Dictated by:   Rande Brunt. Eda Paschal, M.D. Attending Physician:  Sharon Mt DD:  07/06/01 TD:  07/06/01 Job: [id number]

## 2011-03-15 NOTE — H&P (Signed)
[facility]  Patient:    Melissa [name], Melissa [name] Visit Number: [id number] MRN: [id number]          Service Type: Attending:  Rande Brunt. Eda Paschal, M.D. Dictated by:   Rande Brunt. Eda Paschal, M.D.                           History and Physical  CHIEF COMPLAINT:  Severe menometrorrhagia with anemia and leiomyomata uteri.  HISTORY OF PRESENT ILLNESS:  The patient is a 60 year old, gravida 2, para 2, who presented to the office approximately eight months ago with a history of severe menometrorrhagia.  On ultrasound she had an exceedingly large submucous fibroid of 7 x 5 x 5 cm.  She has tried oral contraceptives to control this without any success.  She was placed on Depot Lupron at one point because her hemoglobin had dropped as low as 5.4 because of menometrorrhagia, and she has been completely asymptomatic without any bleeding on the Depot Lupron.  In addition, her hemoglobin has gone from 5.4 back to 13 with Depot Lupron and iron.  She now enters the hospital for vaginal hysterectomy because of severe menorrhagia with a large submucous fibroid that is 7 x 6 cm.  She has had two previous delivers and they were both by C-section.  She understands that because of her lack of vaginal delivery plus the large size of the myoma, that it may not be possible to do a vaginal hysterectomy, and if it is not, she has consented to total abdominal hysterectomy, but hopefully we can accomplish her surgery vaginally.  We have spent two or three counseling sessions discussing pros and cons of removing her ovary, including the issues of hormonal replacement versus the issues of ovarian cancer, and she would like to keep her ovaries as long as they look healthy, but has given me permission to remove one or both for significant disease.  She now enters the hospital for the above.  PAST MEDICAL HISTORY:  No really serious illnesses.  CURRENT MEDICATIONS: 1. Depot Lupron. 2.  Iron.  ALLERGIES:  No known drug allergies.  FAMILY HISTORY:  [name] is diabetic and mother is hypertensive.  SOCIAL HISTORY:  She is a nonsmoker and nondrinker.  REVIEW OF SYSTEMS:  HEENT:  Negative.  CARDIAC:  Negative.  RESPIRATORY: Negative.  GI:  Negative.  GU:  Negative.  NEUROMUSCULAR:  Negative. ENDOCRINE:  Negative.  PHYSICAL EXAMINATION:  VITAL SIGNS:  Blood pressure 130/70, pulse 80 and regular, respirations 16 and nonlabored.  She is afebrile.  GENERAL:  The patient is a well-developed, well-nourished female in no acute distress.  HEENT:  Within normal limits.  NECK:  Supple and trachea midline.  Thyroid is not enlarged.  LUNGS:  Clear to auscultation and percussion.  HEART:  No thrills, heaves, or murmurs.  BREASTS:  No masses.  ABDOMEN:  Soft without guarding, rebound, or masses.  PELVIC:  External and vaginal is within normal limits.  Cervix is clean.  Pap smear shows no atypia.  Uterus is enlarged by myomas from 8-9 week size. Adnexa palpably normal.  RECTAL:  Negative.  EXTREMITIES:  Within normal limits.  ADMISSION IMPRESSION:  Severe menometrorrhagia with anemia, submucous myoma.  PLAN:  Vaginal hysterectomy, possible total abdominal hysterectomy. Dictated by:   Rande Brunt. Eda Paschal, M.D. Attending:  Rande Brunt. Eda Paschal, M.D. DD:  07/06/01 TD:  07/06/01 Job: [id number]

## 2011-05-30 ENCOUNTER — Encounter: Payer: Self-pay | Admitting: Internal Medicine

## 2011-06-28 ENCOUNTER — Encounter: Payer: Self-pay | Admitting: Internal Medicine

## 2011-06-28 ENCOUNTER — Ambulatory Visit (INDEPENDENT_AMBULATORY_CARE_PROVIDER_SITE_OTHER): Payer: BC Managed Care – PPO | Admitting: Internal Medicine

## 2011-06-28 DIAGNOSIS — I442 Atrioventricular block, complete: Secondary | ICD-10-CM

## 2011-06-28 DIAGNOSIS — I428 Other cardiomyopathies: Secondary | ICD-10-CM

## 2011-06-28 DIAGNOSIS — R55 Syncope and collapse: Secondary | ICD-10-CM

## 2011-06-28 DIAGNOSIS — Z950 Presence of cardiac pacemaker: Secondary | ICD-10-CM

## 2011-06-28 LAB — PACEMAKER DEVICE OBSERVATION
ATRIAL PACING PM: 30
BAMS-0001: 175 {beats}/min
BATTERY VOLTAGE: 2.79 V
RV LEAD IMPEDENCE PM: 502 Ohm
VENTRICULAR PACING PM: 100

## 2011-06-28 NOTE — Assessment & Plan Note (Signed)
The patient's device was interrogated.  The information was reviewed. No changes were made in the programming.    

## 2011-06-28 NOTE — Assessment & Plan Note (Signed)
She is device dependent with good heart rate excursion

## 2011-06-28 NOTE — Assessment & Plan Note (Addendum)
Continue current medications; and the absence of more congestive symptoms, will refrain from aldosterone antagonism  We will reassess LV fn in a bout 6 months

## 2011-06-28 NOTE — Progress Notes (Signed)
  HPI  Melissa [name] is a 60 y.o. female is seen in followup for a mild nonischemic cardiomyopathy, ejection fraction 35-40%. Cardiac catheterization demonstrated nonobstructive coronary disease. Ejection fraction on that was 20-25%. Repeat ultrasound showed EF of 35-40%. She had pacemaker placed,;initiation of carvediloll and ramipril have been associated with improvement in energy  A recent echo was November 2011 with an ejection fraction of 30%. He has no significant symptoms of exercise intolerance.The patient denies SOB, chest pain edema or palpitations.  There has been no syncope or presyncope.    Past Medical History  Diagnosis Date  . CAD (coronary artery disease)     native vessel  . Presence of permanent cardiac pacemaker   . 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 RBBB (right bundle branch block)   . Syncope   . Anxiety     Past Surgical History  Procedure Date  . Pacemaker insertion   . Hysterectomy     Current Outpatient Prescriptions  Medication Sig Dispense Refill  . calcium carbonate (OS-CAL) 600 MG TABS Take 600 mg by mouth 2 (two) times daily with a meal.        . carvedilol (COREG) 12.5 MG tablet Take 12.5 mg by mouth 2 (two) times daily with a meal.        . Multiple Vitamins-Minerals ([facility] SPECTRAVITE PO) Take 1 tablet by mouth daily.        . ramipril (ALTACE) 5 MG capsule Take 5 mg by mouth daily.        . rosuvastatin (CRESTOR) 10 MG tablet Take 10 mg by mouth daily.          Allergies  Allergen Reactions  . Prednisone     REACTION: GI distress    Review of Systems negative except from HPI and PMH  Physical Exam Well developed and well nourished in no acute distress HENT normal E scleral and icterus clear Neck Supple JVP flat; carotids brisk and full Clear to ausculation Regular rate and rhythm, no murmurs gallops or rub Soft with active bowel sounds No clubbing cyanosis and edema Alert and oriented, grossly normal motor and sensory  function Skin Warm and Dry  ECG P. Synchronous pacing  Assessment and  Plan

## 2011-06-28 NOTE — Assessment & Plan Note (Signed)
No recurrent syncope 

## 2011-06-28 NOTE — Patient Instructions (Signed)
Your physician wants you to follow-up in: 6 months with Dr. Graciela Husbands. You will receive a reminder letter in the mail two months in advance. If you don't receive a letter, please call our office to schedule the follow-up appointment.  Your physician has requested that you have an echocardiogram in 6 months. Echocardiography is a painless test that uses sound waves to create images of your heart. It provides your doctor with information about the size and shape of your heart and how well your heart's chambers and valves are working. This procedure takes approximately one hour. There are no restrictions for this procedure.

## 2011-08-03 ENCOUNTER — Other Ambulatory Visit: Payer: Self-pay | Admitting: Internal Medicine

## 2011-08-07 ENCOUNTER — Telehealth: Payer: Self-pay | Admitting: Internal Medicine

## 2011-08-07 NOTE — Telephone Encounter (Signed)
Ramipril crestor coreg 90 day supply [facility] [address]

## 2011-08-07 NOTE — Telephone Encounter (Signed)
Heather, I cant access the med list to reorder, it looks like you may already be working on this patients medication list or possibly changing something in it.  Please advise

## 2011-08-08 ENCOUNTER — Other Ambulatory Visit: Payer: Self-pay | Admitting: Internal Medicine

## 2011-08-08 MED ORDER — ROSUVASTATIN CALCIUM 10 MG PO TABS: 10.0000 mg | ORAL_TABLET | Freq: Every day | ORAL | Status: DC

## 2011-08-08 MED ORDER — RAMIPRIL 5 MG PO CAPS: 5.0000 mg | ORAL_CAPSULE | Freq: Every day | ORAL | Status: DC

## 2011-08-08 MED ORDER — CARVEDILOL 12.5 MG PO TABS: 12.5000 mg | ORAL_TABLET | Freq: Two times a day (BID) | ORAL | Status: DC

## 2011-08-08 NOTE — Telephone Encounter (Signed)
Pt almost out needs refill asap, requesting call when done @ [phone]

## 2011-08-08 NOTE — Telephone Encounter (Signed)
Refills done.

## 2011-08-12 ENCOUNTER — Telehealth: Payer: Self-pay | Admitting: Internal Medicine

## 2011-08-12 NOTE — Telephone Encounter (Signed)
Pt called wants to know if ok to get flu shot with meds she takes please call

## 2011-08-12 NOTE — Telephone Encounter (Signed)
Pt okay to take flu shot with current list of meds  Mylo Red RN

## 2011-11-15 ENCOUNTER — Other Ambulatory Visit (HOSPITAL_COMMUNITY): Payer: Self-pay | Admitting: Radiology

## 2011-11-18 ENCOUNTER — Ambulatory Visit (HOSPITAL_COMMUNITY): Payer: BC Managed Care – PPO | Attending: Cardiology | Admitting: Radiology

## 2011-11-18 DIAGNOSIS — R55 Syncope and collapse: Secondary | ICD-10-CM | POA: Insufficient documentation

## 2011-11-18 DIAGNOSIS — I428 Other cardiomyopathies: Secondary | ICD-10-CM

## 2011-12-03 ENCOUNTER — Encounter: Payer: Self-pay | Admitting: Internal Medicine

## 2011-12-03 ENCOUNTER — Ambulatory Visit (INDEPENDENT_AMBULATORY_CARE_PROVIDER_SITE_OTHER): Payer: BC Managed Care – PPO | Admitting: Internal Medicine

## 2011-12-03 DIAGNOSIS — I428 Other cardiomyopathies: Secondary | ICD-10-CM

## 2011-12-03 DIAGNOSIS — I442 Atrioventricular block, complete: Secondary | ICD-10-CM

## 2011-12-03 DIAGNOSIS — R55 Syncope and collapse: Secondary | ICD-10-CM

## 2011-12-03 DIAGNOSIS — Z950 Presence of cardiac pacemaker: Secondary | ICD-10-CM

## 2011-12-03 LAB — PACEMAKER DEVICE OBSERVATION
AL AMPLITUDE: 1 mv
AL THRESHOLD: 0.375 V
BAMS-0001: 175 {beats}/min
RV LEAD THRESHOLD: 0.75 V

## 2011-12-03 NOTE — Progress Notes (Signed)
  HPI  Melissa [name] is a 61 y.o. female is seen in followup for a mild nonischemic cardiomyopathy, ejection fraction 35-40%. Cardiac catheterization demonstrated nonobstructive coronary disease. Ejection fraction on that was 20-25%. Repeat ultrasound showed EF of 35-40%. She had pacemaker placed 2010  initiation of carvediloll and ramipril have been associated with improvement in energy  ECHO November 2011 with an ejection fraction of 30%.   The patient denies chest pain, shortness of breath, nocturnal dyspnea, orthopnea or peripheral edema.  There have been no palpitations, lightheadedness or syncope.        Past Medical History  Diagnosis Date  . Nonischemic cardiomyopathy   . Pacemaker   . 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 RBBB (right bundle branch block)   . Syncope   . Anxiety     Past Surgical History  Procedure Date  . Pacemaker insertion   . Hysterectomy     Current Outpatient Prescriptions  Medication Sig Dispense Refill  . calcium carbonate (OS-CAL) 600 MG TABS Take 600 mg by mouth 2 (two) times daily with a meal.        . carvedilol (COREG) 12.5 MG tablet Take 1 tablet (12.5 mg total) by mouth 2 (two) times daily with a meal.  180 tablet  3  . Multiple Vitamins-Minerals ([employer] SPECTRAVITE PO) Take 1 tablet by mouth daily.        . ramipril (ALTACE) 5 MG capsule Take 1 capsule (5 mg total) by mouth daily.  90 capsule  3  . rosuvastatin (CRESTOR) 10 MG tablet Take 1 tablet (10 mg total) by mouth daily.  90 tablet  3    Allergies  Allergen Reactions  . Prednisone     REACTION: GI distress    Review of Systems negative except from HPI and PMH  Physical Exam Well developed and well nourished in no acute distress HENT normal E scleral and icterus clear Neck Supple JVP flat; carotids brisk and full Clear to ausculation Regular rate and rhythm, no murmurs gallops or rub Soft with active bowel sounds No clubbing cyanosis and edema Alert and oriented,  grossly normal motor and sensory function Skin Warm and Dry  ECG P. Synchronous pacing  Assessment and  Plan

## 2011-12-03 NOTE — Assessment & Plan Note (Signed)
Stable post pacing 

## 2011-12-03 NOTE — Assessment & Plan Note (Signed)
Continue curren meds  With Class 1 symptoms will hold off on aldactone

## 2011-12-03 NOTE — Assessment & Plan Note (Signed)
No recurrent syncope 

## 2011-12-03 NOTE — Assessment & Plan Note (Signed)
The patient's device was interrogated.  The information was reviewed. No changes were made in the programming.    

## 2011-12-03 NOTE — Patient Instructions (Signed)
Your physician wants you to follow-up in: 6 months with Kristin/ Paula & 1 year with Dr. Klein. You will receive a reminder letter in the mail two months in advance. If you don't receive a letter, please call our office to schedule the follow-up appointment.  Your physician recommends that you continue on your current medications as directed. Please refer to the Current Medication list given to you today.  

## 2012-01-28 ENCOUNTER — Telehealth: Payer: Self-pay | Admitting: Internal Medicine

## 2012-01-28 NOTE — Telephone Encounter (Signed)
Pt requesting results of echo

## 2012-01-28 NOTE — Telephone Encounter (Signed)
I spoke with the patient and explained the results of her echo from January.

## 2012-02-12 DIAGNOSIS — I428 Other cardiomyopathies: Secondary | ICD-10-CM | POA: Insufficient documentation

## 2012-02-12 DIAGNOSIS — D219 Benign neoplasm of connective and other soft tissue, unspecified: Secondary | ICD-10-CM | POA: Insufficient documentation

## 2012-02-12 DIAGNOSIS — N938 Other specified abnormal uterine and vaginal bleeding: Secondary | ICD-10-CM | POA: Insufficient documentation

## 2012-02-21 ENCOUNTER — Ambulatory Visit (INDEPENDENT_AMBULATORY_CARE_PROVIDER_SITE_OTHER): Payer: BC Managed Care – PPO | Admitting: Obstetrics and Gynecology

## 2012-02-21 ENCOUNTER — Encounter: Payer: Self-pay | Admitting: Obstetrics and Gynecology

## 2012-02-21 VITALS — BP 120/74 | Ht 64.0 in | Wt 108.0 lb

## 2012-02-21 DIAGNOSIS — Z01419 Encounter for gynecological examination (general) (routine) without abnormal findings: Secondary | ICD-10-CM

## 2012-02-21 NOTE — Progress Notes (Signed)
Patient came to see me today for her annual GYN exam. She had her mammogram this week. She has had 2 normal bone densities. She is having no vaginal bleeding. She is having no pelvic pain. She does her lab through her PCP. She is not having menopausal symptoms. She is currently not sexually active due to her husband ED.  HEENT: Within normal limits. Kennon Portela present. Neck: No masses. Supraclavicular lymph nodes: Not enlarged. Breasts: Examined in both sitting and lying position. Symmetrical without skin changes or masses. Abdomen: Soft no masses guarding or rebound. No hernias. Pelvic: External within normal limits. BUS within normal limits. Vaginal examination shows poor estrogen effect, no cystocele enterocele or rectocele. Cervix and uterus absent. Adnexa within normal limits. Rectovaginal confirmatory. Extremities within normal limits.  Assessment: Atrophic vaginitis-asymptomatic  Plan: Discussed vaginal estrogen if sexual situation changes.

## 2012-02-22 LAB — URINALYSIS W MICROSCOPIC + REFLEX CULTURE
Casts: NONE SEEN
Hgb urine dipstick: NEGATIVE
Ketones, ur: NEGATIVE mg/dL
Nitrite: NEGATIVE
Protein, ur: NEGATIVE mg/dL
Urobilinogen, UA: 0.2 mg/dL (ref 0.0–1.0)

## 2012-02-25 ENCOUNTER — Encounter: Payer: Self-pay | Admitting: Obstetrics and Gynecology

## 2012-02-25 ENCOUNTER — Other Ambulatory Visit: Payer: Self-pay | Admitting: Obstetrics and Gynecology

## 2012-02-25 LAB — URINE CULTURE: Colony Count: >=100000

## 2012-02-25 MED ORDER — CIPROFLOXACIN HCL 500 MG PO TABS: 500.0000 mg | ORAL_TABLET | Freq: Two times a day (BID) | ORAL | Status: AC

## 2012-02-27 ENCOUNTER — Encounter: Payer: Self-pay | Admitting: Obstetrics and Gynecology

## 2012-03-13 ENCOUNTER — Emergency Department (HOSPITAL_COMMUNITY)
Admission: EM | Admit: 2012-03-13 | Discharge: 2012-03-13 | Disposition: A | Discharge status: Home / Self Care | Payer: BC Managed Care – PPO | Attending: Emergency Medicine | Admitting: Emergency Medicine

## 2012-03-13 ENCOUNTER — Emergency Department (HOSPITAL_COMMUNITY): Payer: BC Managed Care – PPO

## 2012-03-13 DIAGNOSIS — S80219A Abrasion, unspecified knee, initial encounter: Secondary | ICD-10-CM

## 2012-03-13 DIAGNOSIS — Y9289 Other specified places as the place of occurrence of the external cause: Secondary | ICD-10-CM | POA: Insufficient documentation

## 2012-03-13 DIAGNOSIS — W010XXA Fall on same level from slipping, tripping and stumbling without subsequent striking against object, initial encounter: Secondary | ICD-10-CM | POA: Insufficient documentation

## 2012-03-13 DIAGNOSIS — S53196A Other dislocation of unspecified ulnohumeral joint, initial encounter: Secondary | ICD-10-CM | POA: Insufficient documentation

## 2012-03-13 DIAGNOSIS — W19XXXA Unspecified fall, initial encounter: Secondary | ICD-10-CM

## 2012-03-13 DIAGNOSIS — S42401A Unspecified fracture of lower end of right humerus, initial encounter for closed fracture: Secondary | ICD-10-CM

## 2012-03-13 MED ORDER — ETOMIDATE 2 MG/ML IV SOLN
INTRAVENOUS | Status: DC | PRN
  Administered 2012-03-13: 8 mg via INTRAVENOUS

## 2012-03-13 MED ORDER — BACITRACIN-NEOMYCIN-POLYMYXIN 400-5-5000 EX OINT
TOPICAL_OINTMENT | CUTANEOUS | Status: AC
  Administered 2012-03-13: 21:00:00
  Filled 2012-03-13: qty 1

## 2012-03-13 MED ORDER — SODIUM CHLORIDE 0.9 % IV SOLN
INTRAVENOUS | Status: DC | PRN
  Administered 2012-03-13: 100 mL/h via INTRAVENOUS

## 2012-03-13 MED ORDER — FENTANYL CITRATE 0.05 MG/ML IJ SOLN
25.0000 ug | Freq: Once | INTRAMUSCULAR | Status: DC
  Filled 2012-03-13: qty 2

## 2012-03-13 MED ORDER — HYDROCODONE-ACETAMINOPHEN 5-500 MG PO TABS: 1.0000 | ORAL_TABLET | Freq: Four times a day (QID) | ORAL | Status: AC | PRN

## 2012-03-13 MED ORDER — SODIUM CHLORIDE 0.9 % IV SOLN
INTRAVENOUS | Status: DC
  Administered 2012-03-13: 20 mL/h via INTRAVENOUS
  Administered 2012-03-13: 20:00:00 via INTRAVENOUS

## 2012-03-13 MED ORDER — TETANUS-DIPHTH-ACELL PERTUSSIS 5-2.5-18.5 LF-MCG/0.5 IM SUSP
0.5000 mL | Freq: Once | INTRAMUSCULAR | Status: AC
  Administered 2012-03-13: 0.5 mL via INTRAMUSCULAR
  Filled 2012-03-13: qty 0.5

## 2012-03-13 MED ORDER — ETOMIDATE 2 MG/ML IV SOLN
8.0000 mg | Freq: Once | INTRAVENOUS | Status: AC
  Filled 2012-03-13: qty 10

## 2012-03-13 MED ORDER — LIDOCAINE HCL 1 % IJ SOLN
INTRAMUSCULAR | Status: AC
  Administered 2012-03-13: 20 mL
  Filled 2012-03-13: qty 20

## 2012-03-13 NOTE — ED Notes (Signed)
Patient transported to CT 

## 2012-03-13 NOTE — Consult Note (Signed)
Reason for Consult:Broken Right Elbow Referring Physician: Carinna Newhart is an 61 y.o. female.  HPI: 61 yo female RHD s/p fall onto concrete today.  She complains of severe right elbow pain and the inability to move the arm.  NO other complaints.  Past Medical History  Diagnosis Date  . Nonischemic cardiomyopathy   . Pacemaker   . 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 RBBB (right bundle branch block)   . Syncope   . Anxiety   . Fibroid   . DUB (dysfunctional uterine bleeding)     Past Surgical History  Procedure Date  . Pacemaker insertion 2010  . Vaginal hysterectomy 07-06-01    Family History  Problem Relation Age of Onset  . Hypertension Mother   . Cancer Father     SKIN CANCER  . Diabetes Paternal Aunt     GREAT PAT AUNT    Social History:  reports that she has never smoked. She does not have any smokeless tobacco history on file. She reports that she does not drink alcohol or use illicit drugs.  Allergies:  Allergies  Allergen Reactions  . Prednisone     REACTION: GI distress    Medications: I have reviewed the patient's current medications.  No results found for this or any previous visit (from the past 48 hour(s)).  Dg Elbow Complete Right  03/13/2012  *RADIOLOGY REPORT*  Clinical Data: Fall  RIGHT ELBOW - COMPLETE 3+ VIEW  Comparison: None.  Findings: There is a comminuted radial head fracture with dislocation posteriorly of the forearm with respect to the humerus. Overlying soft tissue swelling is noted.  No radiopaque foreign body.  IMPRESSION: Comminuted radial head fracture and dislocation of the right elbow.  Original Report Authenticated By: Harrel Lemon, M.D.    ROS Blood pressure 144/87, pulse 69, temperature 97.9 F (36.6 C), temperature source Oral, resp. rate 18, SpO2 97.00%. Physical Exam  AAO, NCAT, EOMI, Right UE, shoulder nontender, elbow swollen and tender, unable to assess ROM due to pain.  Distally NVI L UE: full  pain free ROM, NVI   Assessment/Plan: Right elbow fracture dislocation Plan closed reduction with hematoma block and sedation Long Arm splint CT scan of the right elbow, pre-surgical planning  Follow up with Dr Onalee Hua in the office on Monday morning  [phone]  Arabela Basaldua,STEVEN R 03/13/2012, 7:41 PM

## 2012-03-13 NOTE — ED Notes (Signed)
Portable RT elbow xray being done w/Dr. Arthur Holms at b/s.

## 2012-03-13 NOTE — Discharge Instructions (Signed)
Elevate elbow/arm even while sleeping to help with pain and swelling. Keep splint very clean/dry. Icepack/cold to sore area. Take motrin or aleve as need for pain. You may also take vicodin as need for pain. No driving for the next 6 hours or when taking vicodin. Also, do not take tylenol or acetaminophen containing medication when taking vicodin.  Follow up with orthopedist Monday  - call office Monday morning to verify appointment.   Return to ER if worse, new symptoms, severe pain, numbness, other concern.      Radial Head Fracture A radial head fracture is a break of the smaller bone (radius) in the forearm. The head of this bone is the part near the elbow. These fractures commonly happen during a fall when you land on the outstretched arm. These fractures are more common in middle aged adults and are common with a dislocation of the elbow. SYMPTOMS   Swelling of the elbow joint and pain on the outside of the elbow.   Pain and difficulty in bending or straightening the elbow.   Pain and difficulty in turning the palm of the hand up or down with the elbow bent.  DIAGNOSIS  Your caregiver may make this diagnosis by a physical exam. X-rays can confirm the type and amount of break. Sometimes a break which is not displaced cannot be seen on the original x-ray. TREATMENT  Radial head fractures are classified according to the amount of movement (displacement) of parts from the normal position.  Type 1 Fractures  Type 1 fractures are generally small fractures in which bone pieces remain together (non-displaced fracture).   The fracture may not be seen on initial X-rays. Usually if x-rays are repeated two to three weeks later, the fracture will show up. A splint or sling is used for a few days. Gentle early motion is used to prevent the elbow from becoming stiff. It should not be done vigorously or forced as this could displace the bone pieces.  Type 2 Fractures  With type 2 fractures, bone  pieces are slightly displaced and larger pieces of bone are broken off.   If only a little displacement of the bone piece is present, splinting for 4 to 5 days usually works well. This is again followed with gentle active range of motion. Small fragments may be surgically removed.   Large pieces of bone that can be put back into place will sometimes be fixed with pins or screws to hold them until the bone is healed. If this cannot be done, the fragments are removed. For older, less active people, sometimes the entire radial head is removed if the wrist is not injured. The elbow and arm will still work fine. Soft tissue, tendon, and ligament injuries are corrected at the same time.  Type 3 Fractures  Type 3 fractures have multiple broken pieces of bone which cannot be fixed. Surgery is usually needed to remove the broken bits of bone and what is left of the radial head. Soft-tissue damage is repaired. Gentle early motion is used to prevent the elbow from becoming stiff. Sometimes an artificial radial head can be used to prevent deformity if elbow is instable.  Rest, ice, elevation, immobilization, medications, and pain control are used in the early care. HOME CARE INSTRUCTIONS   Keep the injured part elevated while sitting or lying down. Keep the injury above the level of your heart (the center of the chest). This will decrease swelling and pain.   Apply ice to the  injury for 15 to 20 minutes, 3 to 4 times per day while awake, for 2 days. Put the ice in a plastic bag and place a towel between the bag of ice and your cast or splint.   Move your fingers to avoid stiffness and minimize swelling.   If you have a plaster or fiberglass cast:   Do not try to scratch the skin under the cast using sharp or pointed objects.   Check the skin around the cast every day. You may put lotion on any red or sore areas.   Keep your cast dry and clean.   If you have a plaster splint:   Wear the splint as  directed.   You may loosen the elastic around the splint if your fingers become numb, tingle, or turn cold or blue.   Do not put pressure on any part of your cast or splint. It may break. Rest your cast only on a pillow for the first 24 hours until it is fully hardened.   Your cast or splint can be protected during bathing with a plastic bag. Do not lower the cast or splint into water.   Only take over-the-counter or prescription medicines for pain, discomfort, or fever as directed by your caregiver.   Follow all instructions for follow up with your caregiver. This includes any orthopedic referrals, physical therapy and rehabilitation. Any delay in obtaining necessary care could result in a delay or failure of the bones to heal or permanent elbow stiffness.   Do not over do exercises. This could further damage your injury.  SEEK IMMEDIATE MEDICAL CARE IF:   Your cast or splint gets damaged or breaks.   You have more severe pain or swelling than you did before getting the cast.   You have severe pain when stretching your fingers.   There is a bad smell, new stains and/or pus-like (purulent) drainage coming from under the cast.   Your fingers or hand turn pale or blue, become cold, or you lose feeling.  Document Released: 08/05/2006 Document Revised: 10/03/2011 Document Reviewed: 09/12/2009 [facility] Patient Information 2012 [place], [place].       Elbow Dislocation Elbow dislocation is the displacement of the bones that form the elbow joint. Three bones come together to form the elbow. The humerus is the bone in the upper arm. The radius and ulna are the 2 bones in the forearm that form the lower part of the elbow. The elbow is held in place by very strong, fibrous tissues (ligaments) that connect the bones to each other. CAUSES Elbow dislocations are not common. Typically, they occur when a person falls forward with hands and elbows outstretched. The force of the impact is sent to  the elbow. Usually, there is a twisting motion in this force. Elbow dislocations also happen during car crashes when passengers reach out to brace themselves during the impact. RISK FACTORS Although dislocation of the elbow can happen to anyone, some people are at greater risk than others. People at increased risk of elbow dislocation include:  People born with greater looseness in their ligaments.   People born with an ulna bone that has a shallow groove for the elbow hinge joint.  SYMPTOMS Symptoms of a complete elbow dislocation usually are obvious. They include extreme pain and the appearance of a deformed arm.  Symptoms of a partial dislocation may not be obvious. Your elbow may move somewhat, but you may have pain and swelling. Also, there will likely be bruising  on the inside and outside of your elbow where ligaments have been stretched or torn.  DIAGNOSIS  To diagnose elbow dislocation, your caregiver will perform a physical exam. During this exam, your caregiver will check your arm for tenderness, swelling, and deformity. The skin around your arm and the circulation in your arm also will be checked. Your pulse will be checked at your wrist. If your artery is injured during dislocation, your hand will be cool to the touch and may be white or purple in color. Your caregiver also may check your arm and your ability to move your wrist and fingers to see if you had any damage to your nerves during dislocation. An X-ray exam also may be done to determine if there is bone injury. Results of an X-ray exam can help show the direction of the dislocation. If you have a simple dislocation, there is no major bone injury. If you have a complex dislocation, you may have broken bones (fractures) associated with the ligament injuries. TREATMENT For a simple elbow dislocation, your bones can usually be realigned in a procedure called a reduction. This is a treatment in which your bones are manually moved back  into place either with the use of numbing medicine (regional anesthetic) around your elbow or medicine to make you sleep (general anesthetic). Then your elbow is kept immobile with a sling or a splint for 2 to 3 weeks. This is followed with physical therapy to help your joint move again. Complex elbow dislocation may require surgery to restore joint alignment and repair ligaments. After surgery, your elbow may be protected with an external hinge. This device keeps your elbow from dislocating again while motion exercises are done. Additional surgery may be needed to repair any injuries to blood vessels and nerves or bones and ligaments or to relieve pressure from excessive swelling around the muscles. HOME CARE INSTRUCTIONS The following measures can help to reduce pain and hasten the healing process:  Rest your injured joint. Do not move it. Avoid activities similar to the one that caused your injury.   Exercise your hand and fingers as instructed by your caregiver.   Apply ice to your injured joint for 1 to 2 days after your reduction or as directed by your caregiver. Applying ice helps to reduce inflammation and pain.   Put ice in a plastic bag.   Place a towel between your skin and the bag.   Leave the ice on for 15 to 20 minutes at a time, every couple of hours while you are awake.   Elevate your arm above your heart and move your wrist and fingers as instructed by your caregiver to help limit swelling.   Take over-the-counter or prescription medicines for pain as directed by your caregiver.  SEEK IMMEDIATE MEDICAL CARE IF:  Your splint becomes damaged.   You have an external hinge and it becomes loose or will not move.   You have an external hinge and you develop drainage around the pins.   Your pain becomes worse rather than better.   You lose feeling in your hand or fingers.  MAKE SURE YOU:  Understand these instructions.   Will watch your condition.   Will get help right  away if you are not doing well or get worse.  Document Released: 10/08/2001 Document Revised: 10/03/2011 Document Reviewed: 03/14/2011 [facility] Patient Information 2012 [place], [place].      Cryotherapy Cryotherapy means treatment with cold. Ice or gel packs can be used to  reduce both pain and swelling. Ice is the most helpful within the first 24 to 48 hours after an injury or flareup from overusing a muscle or joint. Sprains, strains, spasms, burning pain, shooting pain, and aches can all be eased with ice. Ice can also be used when recovering from surgery. Ice is effective, has very few side effects, and is safe for most people to use. PRECAUTIONS  Ice is not a safe treatment option for people with:  Raynaud's phenomenon. This is a condition affecting small blood vessels in the extremities. Exposure to cold may cause your problems to return.   Cold hypersensitivity. There are many forms of cold hypersensitivity, including:   Cold urticaria. Red, itchy hives appear on the skin when the tissues begin to warm after being iced.   Cold erythema. This is a red, itchy rash caused by exposure to cold.   Cold hemoglobinuria. Red blood cells break down when the tissues begin to warm after being iced. The hemoglobin that carry oxygen are passed into the urine because they cannot combine with blood proteins fast enough.   Numbness or altered sensitivity in the area being iced.  If you have any of the following conditions, do not use ice until you have discussed cryotherapy with your caregiver:  Heart conditions, such as arrhythmia, angina, or chronic heart disease.   High blood pressure.   Healing wounds or open skin in the area being iced.   Current infections.   Rheumatoid arthritis.   Poor circulation.   Diabetes.  Ice slows the blood flow in the region it is applied. This is beneficial when trying to stop inflamed tissues from spreading irritating chemicals to surrounding tissues.  However, if you expose your skin to cold temperatures for too long or without the proper protection, you can damage your skin or nerves. Watch for signs of skin damage due to cold. HOME CARE INSTRUCTIONS Follow these tips to use ice and cold packs safely.  Place a dry or damp towel between the ice and skin. A damp towel will cool the skin more quickly, so you may need to shorten the time that the ice is used.   For a more rapid response, add gentle compression to the ice.   Ice for no more than 10 to 20 minutes at a time. The bonier the area you are icing, the less time it will take to get the benefits of ice.   Check your skin after 5 minutes to make sure there are no signs of a poor response to cold or skin damage.   Rest 20 minutes or more in between uses.   Once your skin is numb, you can end your treatment. You can test numbness by very lightly touching your skin. The touch should be so light that you do not see the skin dimple from the pressure of your fingertip. When using ice, most people will feel these normal sensations in this order: cold, burning, aching, and numbness.   Do not use ice on someone who cannot communicate their responses to pain, such as small children or people with dementia.  HOW TO MAKE AN ICE PACK Ice packs are the most common way to use ice therapy. Other methods include ice massage, ice baths, and cryo-sprays. Muscle creams that cause a cold, tingly feeling do not offer the same benefits that ice offers and should not be used as a substitute unless recommended by your caregiver. To make an ice pack, do one of the  following:  Place crushed ice or a bag of frozen vegetables in a sealable plastic bag. Squeeze out the excess air. Place this bag inside another plastic bag. Slide the bag into a pillowcase or place a damp towel between your skin and the bag.   Mix 3 parts water with 1 part rubbing alcohol. Freeze the mixture in a sealable plastic bag. When you remove  the mixture from the freezer, it will be slushy. Squeeze out the excess air. Place this bag inside another plastic bag. Slide the bag into a pillowcase or place a damp towel between your skin and the bag.  SEEK MEDICAL CARE IF:  You develop white spots on your skin. This may give the skin a blotchy (mottled) appearance.   Your skin turns blue or pale.   Your skin becomes waxy or hard.   Your swelling gets worse.  MAKE SURE YOU:   Understand these instructions.   Will watch your condition.   Will get help right away if you are not doing well or get worse.  Document Released: 06/10/2011 Document Revised: 10/03/2011 Document Reviewed: 06/10/2011 [facility] Patient Information 2012 [place], [place].      Abrasions Abrasions are skin scrapes. Their treatment depends on how large and deep the abrasion is. Abrasions do not extend through all layers of the skin. A cut or lesion through all skin layers is called a laceration. HOME CARE INSTRUCTIONS   If you were given a dressing, change it at least once a day or as instructed by your caregiver. If the bandage sticks, soak it off with a solution of water or hydrogen peroxide.   Twice a day, wash the area with soap and water to remove all the cream/ointment. You may do this in a sink, under a tub faucet, or in a shower. Rinse off the soap and pat dry with a clean towel. Look for signs of infection (see below).   Reapply cream/ointment according to your caregiver's instruction. This will help prevent infection and keep the bandage from sticking. Telfa or gauze over the wound and under the dressing or wrap will also help keep the bandage from sticking.   If the bandage becomes wet, dirty, or develops a foul smell, change it as soon as possible.   Only take over-the-counter or prescription medicines for pain, discomfort, or fever as directed by your caregiver.  SEEK IMMEDIATE MEDICAL CARE IF:   Increasing pain in the wound.   Signs of  infection develop: redness, swelling, surrounding area is tender to touch, or pus coming from the wound.   You have a fever.   Any foul smell coming from the wound or dressing.  Most skin wounds heal within ten days. Facial wounds heal faster. However, an infection may occur despite proper treatment. You should have the wound checked for signs of infection within 24 to 48 hours or sooner if problems arise. If you were not given a wound-check appointment, look closely at the wound yourself on the second day for early signs of infection listed above. MAKE SURE YOU:   Understand these instructions.   Will watch your condition.   Will get help right away if you are not doing well or get worse.  Document Released: 07/24/2005 Document Revised: 10/03/2011 Document Reviewed: 09/17/2011 [facility] Patient Information 2012 [place], [place].

## 2012-03-13 NOTE — ED Notes (Signed)
Dr. Arthur Holms and Dr. Denton Lank at b/s to perform moderate sedation and elbow reduction.

## 2012-03-13 NOTE — ED Notes (Signed)
Reduction of RT elbow by Dr. Arthur Holms.

## 2012-03-13 NOTE — ED Notes (Signed)
Pt was walking on uneven sidewalk, tripped and fell onto RT elbow.  Swelling to RT forearm noted.  RT knee abrasion also.  Denies LOC or other areas of pain.

## 2012-03-13 NOTE — ED Provider Notes (Signed)
History     CSN: [id number]  Arrival date & time 03/13/12  1646   First MD Initiated Contact with Patient 03/13/12 1723      Chief Complaint  Patient presents with  . Fall  . Joint Swelling    (Consider location/radiation/quality/duration/timing/severity/associated sxs/prior treatment) Patient is a 61 y.o. female presenting with fall. The history is provided by the patient.  Fall Pertinent negatives include no fever, no numbness, no abdominal pain, no vomiting and no headaches.  pt states was walking on uneven sidewalk. Toe hit raised piece sidewalk, fell forward onto right arm. C/o right elbow pain. Constant, dull, moderate, worse w movement and palpation. No shoulder or wrist pain. Pain does not radiate. No associated numbness/weakness. Did not hit head.no loc. No faintness or dizziness. Abrasions to knees. Tetanus unknown. No pain w rom legs. No neck or back pain.   Past Medical History  Diagnosis Date  . Nonischemic cardiomyopathy   . Pacemaker   . 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 RBBB (right bundle branch block)   . Syncope   . Anxiety   . Fibroid   . DUB (dysfunctional uterine bleeding)     Past Surgical History  Procedure Date  . Pacemaker insertion 2010  . Vaginal hysterectomy 07-06-01    Family History  Problem Relation Age of Onset  . Hypertension Mother   . Cancer Father     SKIN CANCER  . Diabetes Paternal Aunt     GREAT PAT AUNT    History  Substance Use Topics  . Smoking status: Never Smoker   . Smokeless tobacco: Not on file  . Alcohol Use: No    OB History    Grav Para Term Preterm Abortions TAB SAB Ect Mult Living   2 2 2       1       Review of Systems  Constitutional: Negative for fever.  HENT: Negative for neck pain.   Eyes: Negative for visual disturbance.  Respiratory: Negative for shortness of breath.   Cardiovascular: Negative for chest pain.  Gastrointestinal: Negative for vomiting and abdominal pain.  Genitourinary:  Negative for flank pain.  Musculoskeletal: Negative for back pain.  Skin: Positive for wound.  Neurological: Negative for weakness, numbness and headaches.  Psychiatric/Behavioral: Negative for agitation.    Allergies  Prednisone  Home Medications   Current Outpatient Rx  Name Route Sig Dispense Refill  . CALCIUM CARBONATE 600 MG PO TABS Oral Take 600 mg by mouth 2 (two) times daily with a meal.      . CARVEDILOL 12.5 MG PO TABS Oral Take 1 tablet (12.5 mg total) by mouth 2 (two) times daily with a meal. 180 tablet 3  . [facility] SPECTRAVITE PO Oral Take 1 tablet by mouth daily.      Marland Kitchen RAMIPRIL 5 MG PO CAPS Oral Take 1 capsule (5 mg total) by mouth daily. 90 capsule 3  . ROSUVASTATIN CALCIUM 10 MG PO TABS Oral Take 10 mg by mouth at bedtime.      BP 144/87  Pulse 69  Temp(Src) 97.9 F (36.6 C) (Oral)  Resp 18  SpO2 97%  Physical Exam  Nursing note and vitals reviewed. Constitutional: She is oriented to person, place, and time. She appears well-developed and well-nourished. No distress.  HENT:  Head: Atraumatic.  Eyes: Conjunctivae are normal. No scleral icterus.  Neck: Neck supple. No tracheal deviation present.  Cardiovascular: Normal rate.   Pulmonary/Chest: Effort normal. No respiratory distress.  Abdominal: Soft. Normal appearance. She exhibits no distension. There is no tenderness.  Musculoskeletal:       sts and tenderness right elbow. Limited rom due to pain/swelling. Superficial abrasions to bil knees. Good rom ext w exception right elbow, no other focal bony tenderness noted. Good rom at knees bil. No focal bony tenderness, no effusions, stable/no gross ligament laxity.   Neurological: She is alert and oriented to person, place, and time.       Motor intact bil.   Skin: Skin is warm and dry. No rash noted.  Psychiatric: She has a normal mood and affect.    ED Course  Procedures (including critical care time) Dg Elbow Complete Right  03/13/2012  *RADIOLOGY REPORT*   Clinical Data: Fall  RIGHT ELBOW - COMPLETE 3+ VIEW  Comparison: None.  Findings: There is a comminuted radial head fracture with dislocation posteriorly of the forearm with respect to the humerus. Overlying soft tissue swelling is noted.  No radiopaque foreign body.  IMPRESSION: Comminuted radial head fracture and dislocation of the right elbow.  Original Report Authenticated By: Harrel Lemon, M.D.       MDM  Xrays.  Pt declines any pain med, iv currently.    Reviewed nursing notes, additional hx.   Discussed xray w pt, need for reduction.   Pt now indicates will accept iv/meds for reduction.   Ortho called - Dr Ranell Patrick will see in ed.    Procedural sedation Performed by: Suzi Roots Consent: Verbal consent obtained. Risks and benefits: risks, benefits and alternatives were discussed Required items: required blood products, implants, devices, and special equipment available Patient identity confirmed: arm band and provided demographic data Time out: Immediately prior to procedure a "time out" was called to verify the correct patient, procedure, equipment, support staff and site/side marked as required.  Sedation type: moderate (conscious) sedation NPO time confirmed and considedered  Sedatives: ETOMIDATE  Physician Time at Bedside: 20  Vitals: Vital signs were monitored during sedation. Cardiac Monitor, pulse oximeter Patient tolerance: Patient tolerated the procedure well with no immediate complications. Comments: Pt with uneventful recovered. Returned to pre-procedural sedation baseline     Etomidate/procedural sedation done with orthopedist, dr Ranell Patrick, who reduced elbow and requests ct elbow to help plan subsequent surgery.  Recheck pt fully awake and alert, pulse ox remained > 99% at all times during procedural sedation. Recheck radial pulse 2+.   Recheck xrays, reduced. Dr Ranell Patrick has discussed f/u plan w pt.   Suzi Roots, MD 03/13/12 2136

## 2012-03-16 ENCOUNTER — Encounter (HOSPITAL_COMMUNITY): Payer: Self-pay

## 2012-03-16 ENCOUNTER — Telehealth: Payer: Self-pay | Admitting: Internal Medicine

## 2012-03-16 ENCOUNTER — Other Ambulatory Visit: Payer: Self-pay | Admitting: Orthopedic Surgery

## 2012-03-16 MED ORDER — CHLORHEXIDINE GLUCONATE 4 % EX LIQD: 60.0000 mL | Freq: Once | CUTANEOUS | Status: DC

## 2012-03-16 MED ORDER — CEFAZOLIN SODIUM 1-5 GM-% IV SOLN
1.0000 g | INTRAVENOUS | Status: DC
  Filled 2012-03-16: qty 50

## 2012-03-16 NOTE — Progress Notes (Signed)
Notified marcena [employer] rep of planned procedure. She requests  Call to [phone] when pt arrives to [facility].

## 2012-03-16 NOTE — Telephone Encounter (Signed)
I spoke with the patient. She states she fell Friday walking on the sidewalk. She dislocated her elbow and ended up with a "triad fracture." She is scheduled for surgery tomorrow and wanted to know if she could take her medications. I explained that from a cardiac perspective she should not be restricted from taking her cardiac drugs. She verbalizes understanding.

## 2012-03-16 NOTE — Telephone Encounter (Signed)
New msg Pt wants to talk to you about her meds. She said she broke her elbow. She wants to know should she take her meds prior to surgery tomorrow or not take them.

## 2012-03-17 ENCOUNTER — Encounter (HOSPITAL_COMMUNITY)
Admission: RE | Disposition: A | Discharge status: Home / Self Care | Payer: Self-pay | Source: Ambulatory Visit | Attending: Orthopedic Surgery

## 2012-03-17 ENCOUNTER — Encounter (HOSPITAL_COMMUNITY): Payer: Self-pay | Admitting: Surgery

## 2012-03-17 ENCOUNTER — Ambulatory Visit (HOSPITAL_COMMUNITY): Payer: BC Managed Care – PPO | Admitting: Anesthesiology

## 2012-03-17 ENCOUNTER — Encounter (HOSPITAL_COMMUNITY): Payer: Self-pay | Admitting: Anesthesiology

## 2012-03-17 ENCOUNTER — Inpatient Hospital Stay (HOSPITAL_COMMUNITY)
Admission: RE | Admit: 2012-03-17 | Discharge: 2012-03-18 | DRG: 223 | Disposition: A | Discharge status: Home / Self Care | Payer: BC Managed Care – PPO | Source: Ambulatory Visit | Attending: Orthopedic Surgery | Admitting: Orthopedic Surgery

## 2012-03-17 ENCOUNTER — Ambulatory Visit (HOSPITAL_COMMUNITY): Payer: BC Managed Care – PPO

## 2012-03-17 DIAGNOSIS — Z01810 Encounter for preprocedural cardiovascular examination: Secondary | ICD-10-CM

## 2012-03-17 DIAGNOSIS — X58XXXA Exposure to other specified factors, initial encounter: Secondary | ICD-10-CM | POA: Diagnosis present

## 2012-03-17 DIAGNOSIS — I428 Other cardiomyopathies: Secondary | ICD-10-CM | POA: Diagnosis present

## 2012-03-17 DIAGNOSIS — S52009A Unspecified fracture of upper end of unspecified ulna, initial encounter for closed fracture: Principal | ICD-10-CM | POA: Diagnosis present

## 2012-03-17 DIAGNOSIS — Z79899 Other long term (current) drug therapy: Secondary | ICD-10-CM

## 2012-03-17 DIAGNOSIS — Z950 Presence of cardiac pacemaker: Secondary | ICD-10-CM

## 2012-03-17 DIAGNOSIS — Z01818 Encounter for other preprocedural examination: Secondary | ICD-10-CM

## 2012-03-17 DIAGNOSIS — D259 Leiomyoma of uterus, unspecified: Secondary | ICD-10-CM | POA: Diagnosis present

## 2012-03-17 HISTORY — DX: Essential (primary) hypertension: I10

## 2012-03-17 HISTORY — PX: ORIF ELBOW FRACTURE: SHX5031

## 2012-03-17 LAB — CBC
MCV: 94.7 fL (ref 78.0–100.0)
Platelets: 197 10*3/uL (ref 150–400)
RBC: 3.97 MIL/uL (ref 3.87–5.11)
RDW: 13.2 % (ref 11.5–15.5)
WBC: 4.9 10*3/uL (ref 4.0–10.5)

## 2012-03-17 LAB — COMPREHENSIVE METABOLIC PANEL
ALT: 20 U/L (ref 0–35)
AST: 26 U/L (ref 0–37)
Albumin: 3.9 g/dL (ref 3.5–5.2)
CO2: 26 mEq/L (ref 19–32)
Chloride: 102 mEq/L (ref 96–112)
Creatinine, Ser: 0.74 mg/dL (ref 0.50–1.10)
GFR calc non Af Amer: 90 mL/min — ABNORMAL LOW (ref >90)
Potassium: 3.5 mEq/L (ref 3.5–5.1)
Sodium: 140 mEq/L (ref 135–145)
Total Bilirubin: 0.6 mg/dL (ref 0.3–1.2)

## 2012-03-17 LAB — DIFFERENTIAL
Basophils Relative: 0 % (ref 0–1)
Eosinophils Relative: 4 % (ref 0–5)
Monocytes Absolute: 0.4 10*3/uL (ref 0.1–1.0)
Monocytes Relative: 9 % (ref 3–12)
Neutro Abs: 2.7 10*3/uL (ref 1.7–7.7)

## 2012-03-17 LAB — SURGICAL PCR SCREEN: Staphylococcus aureus: NEGATIVE

## 2012-03-17 SURGERY — OPEN REDUCTION INTERNAL FIXATION (ORIF) ELBOW/OLECRANON FRACTURE
Anesthesia: General | Site: Elbow | Laterality: Right | Wound class: Clean

## 2012-03-17 MED ORDER — SENNA 8.6 MG PO TABS
1.0000 | ORAL_TABLET | Freq: Two times a day (BID) | ORAL | Status: DC
  Filled 2012-03-17 (×3): qty 1

## 2012-03-17 MED ORDER — RAMIPRIL 5 MG PO CAPS
5.0000 mg | ORAL_CAPSULE | Freq: Once | ORAL | Status: AC
  Administered 2012-03-18: 5 mg via ORAL
  Filled 2012-03-17: qty 1

## 2012-03-17 MED ORDER — CARVEDILOL 12.5 MG PO TABS
12.5000 mg | ORAL_TABLET | Freq: Two times a day (BID) | ORAL | Status: DC
  Administered 2012-03-18: 12.5 mg via ORAL
  Filled 2012-03-17 (×3): qty 1

## 2012-03-17 MED ORDER — FENTANYL CITRATE 0.05 MG/ML IJ SOLN
INTRAMUSCULAR | Status: DC | PRN
  Administered 2012-03-17: 50 ug via INTRAVENOUS
  Administered 2012-03-17: 25 ug via INTRAVENOUS

## 2012-03-17 MED ORDER — ATORVASTATIN CALCIUM 20 MG PO TABS
20.0000 mg | ORAL_TABLET | Freq: Every day | ORAL | Status: DC
  Filled 2012-03-17: qty 1

## 2012-03-17 MED ORDER — LIDOCAINE HCL (CARDIAC) 20 MG/ML IV SOLN
INTRAVENOUS | Status: DC | PRN
  Administered 2012-03-17: 40 mg via INTRAVENOUS

## 2012-03-17 MED ORDER — OXYCODONE HCL 5 MG PO TABS: 5.0000 mg | ORAL_TABLET | ORAL | Status: DC | PRN

## 2012-03-17 MED ORDER — MIDAZOLAM HCL 5 MG/5ML IJ SOLN
INTRAMUSCULAR | Status: DC | PRN
  Administered 2012-03-17: 2 mg via INTRAVENOUS

## 2012-03-17 MED ORDER — DEXAMETHASONE SODIUM PHOSPHATE 4 MG/ML IJ SOLN
INTRAMUSCULAR | Status: DC | PRN
  Administered 2012-03-17: 4 mg via INTRAVENOUS

## 2012-03-17 MED ORDER — SODIUM CHLORIDE 0.45 % IV SOLN: INTRAVENOUS | Status: DC

## 2012-03-17 MED ORDER — PROPOFOL 10 MG/ML IV EMUL
INTRAVENOUS | Status: DC | PRN
  Administered 2012-03-17: 120 mg via INTRAVENOUS

## 2012-03-17 MED ORDER — DIPHENHYDRAMINE HCL 25 MG PO CAPS: 25.0000 mg | ORAL_CAPSULE | Freq: Four times a day (QID) | ORAL | Status: DC | PRN

## 2012-03-17 MED ORDER — 0.9 % SODIUM CHLORIDE (POUR BTL) OPTIME
TOPICAL | Status: DC | PRN
  Administered 2012-03-17: 1000 mL

## 2012-03-17 MED ORDER — CEFAZOLIN SODIUM 1-5 GM-% IV SOLN
1.0000 g | INTRAVENOUS | Status: AC
  Administered 2012-03-18: 1 g via INTRAVENOUS
  Filled 2012-03-17: qty 50

## 2012-03-17 MED ORDER — LORAZEPAM 2 MG/ML IJ SOLN: 1.0000 mg | Freq: Once | INTRAMUSCULAR | Status: DC | PRN

## 2012-03-17 MED ORDER — METHOCARBAMOL 500 MG PO TABS: 500.0000 mg | ORAL_TABLET | Freq: Four times a day (QID) | ORAL | Status: DC | PRN

## 2012-03-17 MED ORDER — MIDAZOLAM HCL 2 MG/2ML IJ SOLN: 1.0000 mg | INTRAMUSCULAR | Status: DC | PRN

## 2012-03-17 MED ORDER — FAMOTIDINE 20 MG PO TABS
20.0000 mg | ORAL_TABLET | Freq: Two times a day (BID) | ORAL | Status: DC | PRN
  Filled 2012-03-17: qty 1

## 2012-03-17 MED ORDER — HYDROMORPHONE HCL PF 1 MG/ML IJ SOLN: 0.5000 mg | INTRAMUSCULAR | Status: DC | PRN

## 2012-03-17 MED ORDER — EPHEDRINE SULFATE 50 MG/ML IJ SOLN
INTRAMUSCULAR | Status: DC | PRN
  Administered 2012-03-17 (×2): 5 mg via INTRAVENOUS

## 2012-03-17 MED ORDER — CALCIUM CARBONATE 600 MG PO TABS
600.0000 mg | ORAL_TABLET | Freq: Two times a day (BID) | ORAL | Status: DC
  Filled 2012-03-17: qty 1

## 2012-03-17 MED ORDER — TEMAZEPAM 15 MG PO CAPS: 15.0000 mg | ORAL_CAPSULE | Freq: Every evening | ORAL | Status: DC | PRN

## 2012-03-17 MED ORDER — SODIUM CHLORIDE 0.45 % IV SOLN
INTRAVENOUS | Status: DC
  Administered 2012-03-18: 1000 mL via INTRAVENOUS

## 2012-03-17 MED ORDER — ONDANSETRON HCL 4 MG/2ML IJ SOLN: 4.0000 mg | Freq: Four times a day (QID) | INTRAMUSCULAR | Status: DC | PRN

## 2012-03-17 MED ORDER — CARVEDILOL 12.5 MG PO TABS
12.5000 mg | ORAL_TABLET | Freq: Once | ORAL | Status: AC
  Administered 2012-03-18: 12.5 mg via ORAL
  Filled 2012-03-17: qty 1

## 2012-03-17 MED ORDER — HYDROMORPHONE HCL PF 1 MG/ML IJ SOLN: 0.2500 mg | INTRAMUSCULAR | Status: DC | PRN

## 2012-03-17 MED ORDER — ONDANSETRON HCL 4 MG/2ML IJ SOLN
INTRAMUSCULAR | Status: DC | PRN
  Administered 2012-03-17: 4 mg via INTRAVENOUS

## 2012-03-17 MED ORDER — ADULT MULTIVITAMIN W/MINERALS CH
1.0000 | ORAL_TABLET | Freq: Every day | ORAL | Status: DC
  Filled 2012-03-17: qty 1

## 2012-03-17 MED ORDER — FENTANYL CITRATE 0.05 MG/ML IJ SOLN: 50.0000 ug | INTRAMUSCULAR | Status: DC | PRN

## 2012-03-17 MED ORDER — METHOCARBAMOL 100 MG/ML IJ SOLN: 500.0000 mg | Freq: Four times a day (QID) | INTRAVENOUS | Status: DC | PRN

## 2012-03-17 MED ORDER — MUPIROCIN 2 % EX OINT
TOPICAL_OINTMENT | CUTANEOUS | Status: AC
  Administered 2012-03-17: 1 via NASAL
  Filled 2012-03-17: qty 22

## 2012-03-17 MED ORDER — ALPRAZOLAM 0.5 MG PO TABS: 0.5000 mg | ORAL_TABLET | Freq: Four times a day (QID) | ORAL | Status: DC | PRN

## 2012-03-17 MED ORDER — RAMIPRIL 5 MG PO CAPS
5.0000 mg | ORAL_CAPSULE | Freq: Every day | ORAL | Status: DC
  Filled 2012-03-17: qty 1

## 2012-03-17 MED ORDER — MUPIROCIN 2 % EX OINT
TOPICAL_OINTMENT | Freq: Two times a day (BID) | CUTANEOUS | Status: DC
  Administered 2012-03-17: 1 via NASAL
  Filled 2012-03-17: qty 22

## 2012-03-17 MED ORDER — ONDANSETRON HCL 4 MG PO TABS: 4.0000 mg | ORAL_TABLET | Freq: Four times a day (QID) | ORAL | Status: DC | PRN

## 2012-03-17 MED ORDER — CEFAZOLIN SODIUM 1-5 GM-% IV SOLN
1.0000 g | Freq: Three times a day (TID) | INTRAVENOUS | Status: DC
  Administered 2012-03-18: 1 g via INTRAVENOUS
  Filled 2012-03-17 (×3): qty 50

## 2012-03-17 MED ORDER — LACTATED RINGERS IV SOLN
INTRAVENOUS | Status: DC | PRN
  Administered 2012-03-17 (×2): via INTRAVENOUS

## 2012-03-17 SURGICAL SUPPLY — 68 items
ANCHOR JUGGERKNOT SHT 1.4 SOFT (Anchor) ×2 IMPLANT
BANDAGE ELASTIC 3 VELCRO ST LF (GAUZE/BANDAGES/DRESSINGS) ×3 IMPLANT
BANDAGE ELASTIC 4 VELCRO ST LF (GAUZE/BANDAGES/DRESSINGS) ×3 IMPLANT
BANDAGE GAUZE ELAST BULKY 4 IN (GAUZE/BANDAGES/DRESSINGS) ×3 IMPLANT
BIOMET JUGGERKNOT SHORT DISPOSABLE SET ×2 IMPLANT
BIT DRILL CANN 1.6 BIODRIVE (BIT) ×4 IMPLANT
BLADE SURG ROTATE 9660 (MISCELLANEOUS) IMPLANT
BNDG CMPR 9X4 STRL LF SNTH (GAUZE/BANDAGES/DRESSINGS) ×2
BNDG ESMARK 4X9 LF (GAUZE/BANDAGES/DRESSINGS) ×3 IMPLANT
Biodrive Micro Screw ×4 IMPLANT
CLOTH BEACON ORANGE TIMEOUT ST (SAFETY) ×3 IMPLANT
CORDS BIPOLAR (ELECTRODE) ×3 IMPLANT
COVER SURGICAL LIGHT HANDLE (MISCELLANEOUS) ×3 IMPLANT
CUFF TOURNIQUET SINGLE 18IN (TOURNIQUET CUFF) ×3 IMPLANT
CUFF TOURNIQUET SINGLE 24IN (TOURNIQUET CUFF) IMPLANT
DECANTER SPIKE VIAL GLASS SM (MISCELLANEOUS) IMPLANT
DRAIN TLS ROUND 10FR (DRAIN) IMPLANT
DRAPE INCISE IOBAN 66X45 STRL (DRAPES) ×2 IMPLANT
DRAPE OEC MINIVIEW 54X84 (DRAPES) ×2 IMPLANT
DRAPE U-SHAPE 47X51 STRL (DRAPES) ×3 IMPLANT
DRSG ADAPTIC 3X8 NADH LF (GAUZE/BANDAGES/DRESSINGS) ×2 IMPLANT
ELECT REM PT RETURN 9FT ADLT (ELECTROSURGICAL)
ELECTRODE REM PT RTRN 9FT ADLT (ELECTROSURGICAL) IMPLANT
GAUZE XEROFORM 1X8 LF (GAUZE/BANDAGES/DRESSINGS) ×3 IMPLANT
GLOVE BIO SURGEON STRL SZ8 (GLOVE) ×2 IMPLANT
GLOVE BIOGEL M STRL SZ7.5 (GLOVE) ×2 IMPLANT
GLOVE BIOGEL PI IND STRL 7.5 (GLOVE) ×1 IMPLANT
GLOVE BIOGEL PI INDICATOR 7.5 (GLOVE) ×1
GLOVE ORTHO TXT STRL SZ7.5 (GLOVE) ×3 IMPLANT
GLOVE SS BIOGEL STRL SZ 8 (GLOVE) ×2 IMPLANT
GLOVE SUPERSENSE BIOGEL SZ 8 (GLOVE) ×1
GOWN PREVENTION PLUS XLARGE (GOWN DISPOSABLE) ×3 IMPLANT
GOWN STRL NON-REIN LRG LVL3 (GOWN DISPOSABLE) ×9 IMPLANT
GOWN STRL REIN XL XLG (GOWN DISPOSABLE) ×6 IMPLANT
JUGGERKNOT SHORT SET 1.4MM (KITS) ×2 IMPLANT
K-WIRE .8 (WIRE) ×6 IMPLANT
KIT BASIN OR (CUSTOM PROCEDURE TRAY) ×3 IMPLANT
KIT ROOM TURNOVER OR (KITS) ×3 IMPLANT
LOOP VESSEL MAXI BLUE (MISCELLANEOUS) ×3 IMPLANT
MANIFOLD NEPTUNE II (INSTRUMENTS) ×3 IMPLANT
NDL BLUNT 16X1.5 OR ONLY (NEEDLE) IMPLANT
NEEDLE 22X1 1/2 (OR ONLY) (NEEDLE) IMPLANT
NEEDLE BLUNT 16X1.5 OR ONLY (NEEDLE) IMPLANT
NS IRRIG 1000ML POUR BTL (IV SOLUTION) ×3 IMPLANT
PACK ORTHO EXTREMITY (CUSTOM PROCEDURE TRAY) ×3 IMPLANT
PAD ARMBOARD 7.5X6 YLW CONV (MISCELLANEOUS) ×6 IMPLANT
PAD CAST 3X4 CTTN HI CHSV (CAST SUPPLIES) ×2 IMPLANT
PAD CAST 4YDX4 CTTN HI CHSV (CAST SUPPLIES) ×2 IMPLANT
PADDING CAST COTTON 3X4 STRL (CAST SUPPLIES) ×6
PADDING CAST COTTON 4X4 STRL (CAST SUPPLIES) ×3
SCREW BIODRIVE MICRO 2.3X16 (Screw) ×2 IMPLANT
SPONGE GAUZE 4X4 12PLY (GAUZE/BANDAGES/DRESSINGS) ×3 IMPLANT
SPONGE LAP 18X18 X RAY DECT (DISPOSABLE) ×2 IMPLANT
SPONGE LAP 4X18 X RAY DECT (DISPOSABLE) ×3 IMPLANT
STAPLER VISISTAT 35W (STAPLE) IMPLANT
STRIP CLOSURE SKIN 1/2X4 (GAUZE/BANDAGES/DRESSINGS) ×2 IMPLANT
SUCTION FRAZIER TIP 10 FR DISP (SUCTIONS) ×3 IMPLANT
SUT ETHILON 4 0 PS 2 18 (SUTURE) IMPLANT
SUT PROLENE 4 0 PS 2 18 (SUTURE) ×2 IMPLANT
SUT VIC AB 3-0 FS2 27 (SUTURE) ×4 IMPLANT
SYR CONTROL 10ML LL (SYRINGE) IMPLANT
SYSTEM CHEST DRAIN TLS 7FR (DRAIN) IMPLANT
TOWEL OR 17X24 6PK STRL BLUE (TOWEL DISPOSABLE) ×3 IMPLANT
TOWEL OR 17X26 10 PK STRL BLUE (TOWEL DISPOSABLE) ×3 IMPLANT
TUBE CONNECTING 12X1/4 (SUCTIONS) ×3 IMPLANT
TUBE EVACUATION TLS (MISCELLANEOUS) ×3 IMPLANT
WATER STERILE IRR 1000ML POUR (IV SOLUTION) ×3 IMPLANT
YANKAUER SUCT BULB TIP NO VENT (SUCTIONS) IMPLANT

## 2012-03-17 NOTE — Anesthesia Procedure Notes (Addendum)
Anesthesia Regional Block:  Supraclavicular block  Pre-Anesthetic Checklist: ,, timeout performed, Correct Patient, Correct Site, Correct Laterality, Correct Procedure, Correct Position, site marked, Risks and benefits discussed,  Surgical consent,  Pre-op evaluation,  At surgeon's request and post-op pain management  Laterality: Right     Needles:  Injection technique: Single-shot  Needle Type: Echogenic Stimulator Needle     Needle Length: 5cm 5 cm Needle Gauge: 22 and 22 G    Additional Needles:  Procedures: ultrasound guided and nerve stimulator Supraclavicular block  Nerve Stimulator or Paresthesia:  Response: 0.5 mA,   Additional Responses:   Narrative:  Start time: 03/17/2012 8:38 PM End time: 03/17/2012 8:54 PM Injection made incrementally with aspirations every 5 mL. Anesthesiologist: Dr Gypsy Balsam  Additional Notes: 2038-2054 R Supraclav Block POP #22 echo/stim needle w/good US guidance stim down to .5ma Marc .5% w/epi 1:200000 total 20cc + decadron 4mg  Multiple neg asp No compl [place] pix in chart Dr Gypsy Balsam     Procedure Name: LMA Insertion Date/Time: 03/17/2012 9:07 PM Performed by: Elon Alas Pre-anesthesia Checklist: Patient identified, Timeout performed, Emergency Drugs available, Suction available and Patient being monitored Patient Re-evaluated:Patient Re-evaluated prior to inductionOxygen Delivery Method: Circle system utilized Preoxygenation: Pre-oxygenation with 100% oxygen Intubation Type: IV induction LMA: LMA inserted LMA Size: 4.0 Number of attempts: 1 Placement Confirmation: positive ETCO2 and breath sounds checked- equal and bilateral Tube secured with: Tape Dental Injury: Teeth and Oropharynx as per pre-operative assessment

## 2012-03-17 NOTE — Anesthesia Postprocedure Evaluation (Signed)
  Anesthesia Post-op Note  Patient: Investment banker, corporate  Procedure(s) Performed: Procedure(s) (LRB): OPEN REDUCTION INTERNAL FIXATION (ORIF) ELBOW/OLECRANON FRACTURE (Right)  Patient Location: PACU  Anesthesia Type: GA combined with regional for post-op pain  Level of Consciousness: awake and alert   Airway and Oxygen Therapy: Patient Spontanous Breathing  Post-op Pain: none  Post-op Assessment: Post-op Vital signs reviewed, Patient's Cardiovascular Status Stable, Respiratory Function Stable, Patent Airway, No signs of Nausea or vomiting and Pain level controlled  Post-op Vital Signs: stable  Complications: No apparent anesthesia complications

## 2012-03-17 NOTE — Transfer of Care (Signed)
Immediate Anesthesia Transfer of Care Note  Patient: Melissa [name]  Procedure(s) Performed: Procedure(s) (LRB): OPEN REDUCTION INTERNAL FIXATION (ORIF) ELBOW/OLECRANON FRACTURE (Right)  Patient Location: PACU  Anesthesia Type: General and Regional  Level of Consciousness: awake, alert  and oriented  Airway & Oxygen Therapy: Patient Spontanous Breathing and Patient connected to nasal cannula oxygen  Post-op Assessment: Report given to PACU RN, Post -op Vital signs reviewed and stable and Patient moving all extremities X 4  Post vital signs: Reviewed and stable  Complications: No apparent anesthesia complications

## 2012-03-17 NOTE — H&P (Signed)
.. Melissa [name] is an 61 y.o. female.    Chief Complaint: Fractured right elbow HPI: Pleasant 61 yo female presents for operative intervention to her right  upper extremity, the patient unfortunatley sustained a terrible triad fracture about the left upper extremity.  She elects to proceed with ORIF and liagmentous reconstruction, possible radial head arthroplasty.The patient does have a HX of 3rd degree AV block and pacemaker implant. Clearance was obtained prior to planned procedure,. She denies any current SOB,CP, N/V, Numbness or tingling of the upper extremity.  Past Medical History  Diagnosis Date  . Nonischemic cardiomyopathy   . 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 RBBB (right bundle branch block)   . Syncope     heart rate < normal, passed out   . Anxiety   . Fibroid   . DUB (dysfunctional uterine bleeding)   . Pacemaker     [employer]- placed 2010  . Hypertension     Past Surgical History  Procedure Date  . Pacemaker insertion 2010  . Vaginal hysterectomy 07-06-01  . Insert / replace / remove pacemaker     Family History  Problem Relation Age of Onset  . Hypertension Mother   . Cancer Father     SKIN CANCER  . Diabetes Paternal Aunt     GREAT PAT AUNT  . Anesthesia problems Neg Hx     Social History:  reports that she has never smoked. She does not have any smokeless tobacco history on file. She reports that she does not drink alcohol or use illicit drugs.  Allergies:  Allergies  Allergen Reactions  . Prednisone     REACTION: GI distress    Medications Prior to Admission  Medication Sig Dispense Refill  . calcium carbonate (OS-CAL) 600 MG TABS Take 600 mg by mouth 2 (two) times daily with a meal.       . carvedilol (COREG) 12.5 MG tablet Take 1 tablet (12.5 mg total) by mouth 2 (two) times daily with a meal.  180 tablet  3  . HYDROcodone-acetaminophen (VICODIN) 5-500 MG per tablet Take 1-2 tablets by mouth every 6 (six) hours as needed for pain.   20 tablet  0  . Multiple Vitamins-Minerals ([facility] SPECTRAVITE PO) Take 1 tablet by mouth daily.        . naproxen sodium (ANAPROX) 220 MG tablet Take 220 mg by mouth 2 (two) times daily with a meal.      . ramipril (ALTACE) 5 MG capsule Take 1 capsule (5 mg total) by mouth daily.  90 capsule  3  . rosuvastatin (CRESTOR) 10 MG tablet Take 10 mg by mouth at bedtime.        Results for orders placed during the hospital encounter of 03/17/12 (from the past 48 hour(s))  CBC     Status: Normal   Collection Time   03/17/12  1:55 PM      Component Value Range Comment   WBC 4.9  4.0 - 10.5 (K/uL)    RBC 3.97  3.87 - 5.11 (MIL/uL)    Hemoglobin 12.7  12.0 - 15.0 (g/dL)    HCT 16.1  09.6 - 04.5 (%)    MCV 94.7  78.0 - 100.0 (fL)    MCH 32.0  26.0 - 34.0 (pg)    MCHC 33.8  30.0 - 36.0 (g/dL)    RDW 40.9  81.1 - 91.4 (%)    Platelets 197  150 - 400 (K/uL)   COMPREHENSIVE METABOLIC PANEL  Status: Abnormal   Collection Time   03/17/12  1:55 PM      Component Value Range Comment   Sodium 140  135 - 145 (mEq/L)    Potassium 3.5  3.5 - 5.1 (mEq/L)    Chloride 102  96 - 112 (mEq/L)    CO2 26  19 - 32 (mEq/L)    Glucose, Bld 95  70 - 99 (mg/dL)    BUN 11  6 - 23 (mg/dL)    Creatinine, Ser 6.04  0.50 - 1.10 (mg/dL)    Calcium 9.3  8.4 - 10.5 (mg/dL)    Total Protein 6.8  6.0 - 8.3 (g/dL)    Albumin 3.9  3.5 - 5.2 (g/dL)    AST 26  0 - 37 (U/L)    ALT 20  0 - 35 (U/L)    Alkaline Phosphatase 42  39 - 117 (U/L)    Total Bilirubin 0.6  0.3 - 1.2 (mg/dL)    GFR calc non Af Amer 90 (*) >90 (mL/min)    GFR calc Af Amer >90  >90 (mL/min)   DIFFERENTIAL     Status: Normal   Collection Time   03/17/12  1:55 PM      Component Value Range Comment   Neutrophils Relative 56  43 - 77 (%)    Neutro Abs 2.7  1.7 - 7.7 (K/uL)    Lymphocytes Relative 31  12 - 46 (%)    Lymphs Abs 1.5  0.7 - 4.0 (K/uL)    Monocytes Relative 9  3 - 12 (%)    Monocytes Absolute 0.4  0.1 - 1.0 (K/uL)    Eosinophils Relative 4   0 - 5 (%)    Eosinophils Absolute 0.2  0.0 - 0.7 (K/uL)    Basophils Relative 0  0 - 1 (%)    Basophils Absolute 0.0  0.0 - 0.1 (K/uL)   SURGICAL PCR SCREEN     Status: Normal   Collection Time   03/17/12  1:56 PM      Component Value Range Comment   MRSA, PCR NEGATIVE  NEGATIVE     Staphylococcus aureus NEGATIVE  NEGATIVE     Dg Chest 2 View  03/17/2012  *RADIOLOGY REPORT*  Clinical Data: Preoperative evaluation for ORIF of the right elbow.  CHEST - 2 VIEW  Comparison: Chest x-ray 04/28/2009.  Findings: There is small dense opacity in the lateral aspect of the right upper lobe, compatible with a calcified granuloma.  Lungs are otherwise clear, without areas of infectious consolidation or pleural effusions.  Pulmonary vasculature is normal.  Heart size is normal.  Mediastinal contours are distorted by patient's positioning (the patient has a profound S-shaped scoliosis of the thoracolumbar spine, comp convex to the right in the thoracic region and to the left in the lumbar region).  Atherosclerotic calcifications are noted within the arch of the aorta.  A pacemaker device is in place on the left side with lead tips projecting over the expected location of the right atrial appendage and right ventricular apex.  IMPRESSION: 1.  No radiographic evidence of acute cardiopulmonary disease. 2. Right upper lobe calcified granuloma again noted. 3.  Atherosclerosis.  Original Report Authenticated By: Florencia Reasons, M.D.     ROS: see HPI otherwise negative  Blood pressure 138/76, pulse 63, temperature 98.3 F (36.8 C), temperature source Oral, resp. rate 19, SpO2 98.00%.  Marland Kitchen.The patient is alert and oriented in no acute distress the patient complains of pain  in the affected upper extremity. The patient is noted to have a normal HEENT exam. Lung fields show equal chest expansion and no shortness of breath abdomen exam is nontender without distention. Lower extremity examination does not show any fracture  dislocation or blood clot symptoms. Pelvis is stable neck and back are stable and nontender RUE is placed in LAS, no signs of NV compromise, digital motion intact   Assessment/Plan Right Elbow comminuted radial Head fracture, prior dislocation ANXIETY  CARDIOMYOPATHY, DILATED  ATRIOVENTRICULAR BLOCK, 3RD DEGREE  SYNCOPE  Pacemaker  Nonischemic cardiomyopathy  Fibroid  DUB (dysfunctional uterine bleeding)   .Marland KitchenWe are planning surgery for your upper extremity. The risk and benefits of surgery include risk of bleeding infection anesthesia damage to normal structures and failure of the surgery to accomplish its intended goals of relieving symptoms and restoring function with this in mind we'll going to proceed. I have specifically discussed with the patient the pre-and postoperative regime and the does and don'ts and risk and benefits in great detail. Risk and benefits of surgery also include risk of dystrophy chronic nerve pain failure of the healing process to go onto completion and other inherent risks of surgery The relavent the pathophysiology of the disease/injury process, as well as the alternatives for treatment and postoperative course of action has been discussed in great detail with the patient who desires to proceed.  We will do everything in our power to help you (the patient) restore function to the upper extremity. Is a pleasure to see this patient today.   Jestine Bicknell L 03/17/2012, 8:26 PM

## 2012-03-17 NOTE — Op Note (Signed)
Dictation # [id number] Oletta Cohn MD

## 2012-03-17 NOTE — Anesthesia Preprocedure Evaluation (Addendum)
Anesthesia Evaluation  Patient identified by MRN, date of birth, ID band Patient awake    Reviewed: Allergy & Precautions, H&P , NPO status , Patient's Chart, lab work & pertinent test results  Airway Mallampati: I TM Distance: >3 FB Neck ROM: Full    Dental   Pulmonary    Pulmonary exam normal       Cardiovascular hypertension, + dysrhythmias + pacemaker Rhythm:Regular Rate:Normal  CHB-Rx pacemaker Nonischemic cardiomyopathy EF 30%  By echo 2011   Neuro/Psych Anxiety    GI/Hepatic   Endo/Other    Renal/GU      Musculoskeletal   Abdominal   Peds  Hematology   Anesthesia Other Findings   Reproductive/Obstetrics                          Anesthesia Physical Anesthesia Plan  ASA: III  Anesthesia Plan: General   Post-op Pain Management:    Induction: Intravenous  Airway Management Planned: LMA  Additional Equipment:   Intra-op Plan:   Post-operative Plan: Extubation in OR  Informed Consent: I have reviewed the patients History and Physical, chart, labs and discussed the procedure including the risks, benefits and alternatives for the proposed anesthesia with the patient or authorized representative who has indicated his/her understanding and acceptance.     Plan Discussed with: CRNA, Surgeon and Anesthesiologist  Anesthesia Plan Comments: (Pt very anxious Upset about late surgery time)       Anesthesia Quick Evaluation

## 2012-03-17 NOTE — OR Nursing (Signed)
Patient has a pacer in place left upper chest. Representative from [employer] here in pre-op to rest before case.

## 2012-03-17 NOTE — Preoperative (Signed)
Beta Blockers   Reason not to administer Beta Blockers:Not Applicable 

## 2012-03-17 NOTE — Progress Notes (Signed)
Spoke with Leta Jungling, with [employer], she requests that Holding area calls her when pt. Arrives there.

## 2012-03-18 MED ORDER — OXYCODONE HCL 5 MG PO CAPS: 10.0000 mg | ORAL_CAPSULE | ORAL | Status: AC | PRN

## 2012-03-18 MED ORDER — CALCIUM CARBONATE 1250 (500 CA) MG PO TABS
1.0000 | ORAL_TABLET | Freq: Two times a day (BID) | ORAL | Status: DC
  Administered 2012-03-18: 500 mg via ORAL
  Filled 2012-03-18 (×3): qty 1

## 2012-03-18 MED ORDER — METHOCARBAMOL 500 MG PO TABS: 500.0000 mg | ORAL_TABLET | Freq: Four times a day (QID) | ORAL | Status: AC | PRN

## 2012-03-18 NOTE — Progress Notes (Signed)
Subjective: 1 Day Post-Op Procedure(s) (LRB): OPEN REDUCTION INTERNAL FIXATION (ORIF) ELBOW/OLECRANON FRACTURE (Right) Patient reports pain as 0 on 0-10 scale patient is doing well she denies complaints this morning. Her block is still working. She had an uneventful elbow reconstruction late last night. She had I nerve block preoperatively which is still working nicely. She is not having any problems that she is aware of. She feels well and is anxious to go home.    Objective: Vital signs in last 24 hours: Temp:  [96.1 F (35.6 C)-98.3 F (36.8 C)] 97 F (36.1 C) (05/22 0530) Pulse Rate:  [63-78] 69  (05/22 0530) Resp:  [11-20] 20  (05/22 0530) BP: (104-157)/(55-84) 104/55 mmHg (05/22 0530) SpO2:  [96 %-100 %] 96 % (05/22 0530) Weight:  [48.535 kg (107 lb)] 48.535 kg (107 lb) (05/22 0000)  Intake/Output from previous day: 05/21 0701 - 05/22 0700 In: 1000 [I.V.:1000] Out: 20 [Blood:20] Intake/Output this shift:     Basename 03/17/12 1355  HGB 12.7    Basename 03/17/12 1355  WBC 4.9  RBC 3.97  HCT 37.6  PLT 197    Basename 03/17/12 1355  NA 140  K 3.5  CL 102  CO2 26  BUN 11  CREATININE 0.74  GLUCOSE 95  CALCIUM 9.3   No results found for this basename: LABPT:2,INR:2 in the last 72 hours  ABD soft Intact pulses distally No cellulitis present Compartment soft  Her arm is without problems splint is clean dry and intact abdomen is nontender chest is clear HEENT is within normal limits.  Assessment/Plan: 1 Day Post-Op Procedure(s) (LRB): OPEN REDUCTION INTERNAL FIXATION (ORIF) ELBOW/OLECRANON FRACTURE (Right) Will plan for DC today  She will comment tonight at 5:00 so I can check on her upper extremity and a block wearing off. She'll return to the office in 12-14 days. She's done very well with her postop recovery to date. She is anxious to go home all questions have been encouraged and answered. Karen Chafe 03/18/2012, 7:33 AM

## 2012-03-18 NOTE — Op Note (Signed)
NAMECOLEEN, CARDIFF NO.:  [id number]  MEDICAL RECORD NO.:  [id number]  LOCATION:  5025                         FACILITY:  [facility]  PHYSICIAN:  Dionne Ano. Telma Pyeatt, M.D.DATE OF BIRTH:  1951/10/12  DATE OF PROCEDURE: DATE OF DISCHARGE:                              OPERATIVE REPORT   PREOPERATIVE DIAGNOSIS:  Terrible triad fracture, right elbow, displaced.  POSTOPERATIVE DIAGNOSIS:  Terrible triad fracture, right elbow, displaced.  PROCEDURE: 1. Open reduction and internal fixation of radial head fracture with     Biomet screw fixation, this was 2.0 screw buried beneath the     subchondral surface x3. 2. Stress radiography. 3. Lateral ulnar collateral ligament repair and reconstruction. 4. Arthrotomy, synovectomy of the joint with closed treatment/open     treatment without fixation of the coronoid fracture.  SURGEON:  Dionne Ano. Amanda Pea, MD  ASSISTANT:  Karie Chimera, PA-C  COMPLICATIONS:  None.  ANESTHESIA:  General preoperative block by Dr. Gypsy Balsam.  TOURNIQUET TIME:  Less than an hour.  INDICATIONS FOR THE PROCEDURE:  The patient is a 61 year old female presents with above-mentioned diagnosis.  I have counseled her in regard to risks and benefits of surgery including risk of infection, bleeding, anesthesia, damage to normal structures, and failure of surgery to accomplish its intended goals of relieving symptoms and restoring function.  With this in mind, she desires to proceed.  All questions have been encouraged and answered preoperatively.  OPERATIVE PROCEDURE:  The patient was seen by myself and Anesthesia, taken to the operative suite and underwent smooth induction of general anesthesia.  Preoperatively, a block was placed, consent verified, pre and postop checklist complete.  She had been underwent careful administration of anesthesia followed by prepped and draped in usual sterile fashion with a 10 minutes surgical Betadine scrub performed  by Mr. Wynona Neat.  Following this, the arm was very carefully placed, outlined, draped and incision was made and 250 mmHg of tourniquet. Total dissection was carried down interval between the ECU and anconeus, was developed carefully keeping the arm in a pronated position to decrease the risk of radial nerve injury.  Following this, the elbow was opened.  Arthrotomy, synovectomy was accomplished.  The coronoid fracture was fairly stable.  I did not place any fixation on it.  The radial head fracture seemed to be amendable to fixation.  Thus, at this time, we performed arthrotomy, synovectomy, debridement and ORIF of the radial head fracture with 3 Biomet 2.0 screws, which were placed just under the subchondral/outer cortical surface, these were buried nicely and achieved excellent fixation.  Following this, she demonstrated full range of motion.  I then placed a JuggerKnot anchor device.  The patient tolerated this well and the patient then had this seated without difficulty.  The lateral ulnar collateral ligament was then repaired with JuggerKnot fixation and a complicated and imbricated stitch with the suture material.  Following this, the patient then had the interval closed with Vicryl.  X-rays of AP, lateral and oblique including a live fluoro showed excellent stability and recreation of excellent anatomy. I was pleased this in the findings.  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  She had some scuffing over the capitellum and certainly with her pre- existing arthrosis well as her age and terrible triad fracture features, she does have a high propensity than usual for arthritis into the future, but truly and I was very pleased with the fixation, her smooth arc and the reconstruction.  She was irrigated copiously at multiple points during the case.  Subcu was closed with 3-0 Vicryl, subcuticular Prolene with Steri-Strips were then placed.  The  patient was placed in a long-arm splint.  She will be admitted for IV antibiotics, general postop observation.  We will plan to discharge her home after an observatory care plan overnight.  I have discussed all issues with she and her family, this note was discussed.  It was pleasure to see and treat her today, wished her best for the future.  I will see her back in 10-14 days, sutures out and we will let her go ahead and go downstairs with therapy for a long-arm splint and begin some very cautious motion. I wanted a long-arm splint at night.  During the daytime, I wanted a hinge brace and locked her out at 45 degrees.  We will allow full flexion and extension to 45 degrees.  At 4 weeks, we will go down to 30 and at 5-6 weeks, go ahead and go to 0, and things were looking well. These notes were discussed and all questions were encouraged and answered.     Dionne Ano. Amanda Pea, M.D.     [facility]  D:  03/17/2012  T:  03/18/2012  Job:  [id number]

## 2012-03-18 NOTE — Progress Notes (Signed)
Orthopedic Tech Progress Note Patient Details:  Akaysha Cobern Jan 12, 1951 [id number]  Other Ortho Devices Ortho Device Location: arm sling Ortho Device Interventions: Application   Cammer, Mickie Bail 03/18/2012, 8:30 AM

## 2012-03-18 NOTE — Discharge Instructions (Signed)

## 2012-03-18 NOTE — Discharge Summary (Signed)
  Please see postop day 1 progress note. Patient presents for evaluation and treatment of the of their upper extremity predicament. The patient denies neck back chest or of abdominal pain. The patient notes that they have no lower extremity problems. The patient from primarily complains of the upper extremity pain noted. The patient is alert and oriented in no acute distress the patient complains of pain in the affected upper extremity. The patient is noted to have a normal HEENT exam. Lung fields show equal chest expansion and no shortness of breath abdomen exam is nontender without distention. Lower extremity examination does not show any fracture dislocation or blood clot symptoms. Pelvis is stable neck and back are stable and nontender   Patient looks quite well, she'll be DC'd home today she will follow up in the office in 12 days. She has my cell phone number and will comment for any problems. The time of discharge she was completely stable Harrell Gave

## 2012-03-19 ENCOUNTER — Encounter (HOSPITAL_COMMUNITY): Payer: Self-pay | Admitting: Orthopedic Surgery

## 2012-06-01 ENCOUNTER — Ambulatory Visit (INDEPENDENT_AMBULATORY_CARE_PROVIDER_SITE_OTHER): Payer: BC Managed Care – PPO | Admitting: *Deleted

## 2012-06-01 ENCOUNTER — Encounter: Payer: Self-pay | Admitting: Internal Medicine

## 2012-06-01 DIAGNOSIS — I442 Atrioventricular block, complete: Secondary | ICD-10-CM

## 2012-06-01 LAB — PACEMAKER DEVICE OBSERVATION
AL IMPEDENCE PM: 608 Ohm
ATRIAL PACING PM: 29
RV LEAD IMPEDENCE PM: 454 Ohm
VENTRICULAR PACING PM: 100

## 2012-06-01 NOTE — Progress Notes (Signed)
PPM check 

## 2012-06-15 ENCOUNTER — Telehealth: Payer: Self-pay | Admitting: *Deleted

## 2012-06-15 NOTE — Telephone Encounter (Signed)
Pt calling requesting recent lab result for health assessment form for pt job.

## 2012-07-20 ENCOUNTER — Other Ambulatory Visit: Payer: Self-pay | Admitting: *Deleted

## 2012-07-20 MED ORDER — CARVEDILOL 12.5 MG PO TABS: 12.5000 mg | ORAL_TABLET | Freq: Two times a day (BID) | ORAL | Status: DC

## 2012-07-20 MED ORDER — RAMIPRIL 5 MG PO CAPS: 5.0000 mg | ORAL_CAPSULE | Freq: Every day | ORAL | Status: DC

## 2012-07-20 MED ORDER — ROSUVASTATIN CALCIUM 10 MG PO TABS: 10.0000 mg | ORAL_TABLET | Freq: Every day | ORAL | Status: DC

## 2012-07-20 NOTE — Telephone Encounter (Signed)
Fax Received. Refill Completed. Melissa [name] (R.M.A)   

## 2012-12-03 ENCOUNTER — Encounter: Payer: Self-pay | Admitting: Internal Medicine

## 2012-12-03 ENCOUNTER — Ambulatory Visit (INDEPENDENT_AMBULATORY_CARE_PROVIDER_SITE_OTHER): Payer: BC Managed Care – PPO | Admitting: Internal Medicine

## 2012-12-03 VITALS — BP 103/67 | HR 66 | Ht 65.0 in | Wt 111.8 lb

## 2012-12-03 DIAGNOSIS — R55 Syncope and collapse: Secondary | ICD-10-CM

## 2012-12-03 DIAGNOSIS — Z950 Presence of cardiac pacemaker: Secondary | ICD-10-CM

## 2012-12-03 DIAGNOSIS — I442 Atrioventricular block, complete: Secondary | ICD-10-CM

## 2012-12-03 DIAGNOSIS — I428 Other cardiomyopathies: Secondary | ICD-10-CM

## 2012-12-03 LAB — PACEMAKER DEVICE OBSERVATION
AL AMPLITUDE: 1 mv
ATRIAL PACING PM: 25
BAMS-0001: 175 {beats}/min
VENTRICULAR PACING PM: 100

## 2012-12-03 NOTE — Assessment & Plan Note (Signed)
Device dependent. 

## 2012-12-03 NOTE — Assessment & Plan Note (Addendum)
Persistent left ventricular dysfunction. Continue ACE inhibitors and beta blockers. We wait lab surveillance of her PCP  We have talked about the potential impact of right ventricular apical pacing. She is currently class I. We will follow along.

## 2012-12-03 NOTE — Patient Instructions (Signed)
Your physician wants you to follow-up in: 6 months with Melissa [name]/Melissa [name] for a device check & 1 year with Dr. Klein. You will receive a reminder letter in the mail two months in advance. If you don't receive a letter, please call our office to schedule the follow-up appointment.  Your physician recommends that you continue on your current medications as directed. Please refer to the Current Medication list given to you today.  

## 2012-12-03 NOTE — Assessment & Plan Note (Signed)
No intercurrent syncope 

## 2012-12-03 NOTE — Assessment & Plan Note (Signed)
The patient's device was interrogated.  The information was reviewed. No changes were made in the programming.    

## 2012-12-03 NOTE — Progress Notes (Signed)
Patient Care Team: Pcp Not In System as PCP - General   HPI  Melissa [name] is a 62 y.o. 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 Cardiac catheterization demonstrated nonobstructive coronary disease. Ejection fraction on that was 20-25%. Repeat echo 1/13  EF of 35-40%.   Initiation of carvedilol and ramipril have been associated with improvement in energy ECHO November 2011 with an ejection fraction of 30%.  The patient denies chest pain, shortness of breath, nocturnal dyspnea, orthopnea or peripheral edema. There have been no palpitations, lightheadedness or syncope.    Past Medical History  Diagnosis Date  . Nonischemic cardiomyopathy   . 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 RBBB (right bundle branch block)   . Syncope     heart rate < normal, passed out   . Anxiety   . Fibroid   . DUB (dysfunctional uterine bleeding)   . Pacemaker     medtronic- placed 2010  . Hypertension     Past Surgical History  Procedure Date  . Pacemaker insertion 2010  . Vaginal hysterectomy 07-06-01  . Insert / replace / remove pacemaker   . Orif elbow fracture 03/17/2012    Procedure: OPEN REDUCTION INTERNAL FIXATION (ORIF) ELBOW/OLECRANON FRACTURE;  Surgeon: Dominica Severin, MD;  Location: [facility];  Service: Orthopedics;  Laterality: Right;  RIGHT ELBOW ORIF WITH LIGAMENT REPAIR, RECONSTRUCTION    Current Outpatient Prescriptions  Medication Sig Dispense Refill  . calcium carbonate (OS-CAL) 600 MG TABS Take 600 mg by mouth 2 (two) times daily with a meal.       . carvedilol (COREG) 12.5 MG tablet Take 1 tablet (12.5 mg total) by mouth 2 (two) times daily with a meal.  180 tablet  3  . Multiple Vitamins-Minerals ([employer] SPECTRAVITE PO) Take 1 tablet by mouth daily.        . naproxen sodium (ANAPROX) 220 MG tablet Take 220 mg by mouth 2 (two) times daily with a meal.      . ramipril  (ALTACE) 5 MG capsule Take 1 capsule (5 mg total) by mouth daily.  90 capsule  3  . rosuvastatin (CRESTOR) 10 MG tablet Take 1 tablet (10 mg total) by mouth at bedtime.  90 tablet  3    Allergies  Allergen Reactions  . Prednisone     REACTION: GI distress    Review of Systems negative except from HPI and PMH  Physical Exam BP 103/67  Pulse 66  Ht 5\' 5"  (1.651 m)  Wt 111 lb 12.8 oz (50.712 kg)  BMI 18.60 kg/m2 Well developed and well nourished in no acute distress HENT normal E scleral and icterus clear Neck Supple JVP flat; carotids brisk and full Clear to auskf sculation Regular rate and rhythm, no murmurs gallops or rub Soft with active bowel sounds No clubbing cyanosis no Edema Alert and oriented, grossly normal motor and sensory function Skin Warm and Dry  ECG demonstrates P. synchronous pacing  Assessment and  Plan

## 2013-02-22 ENCOUNTER — Encounter: Payer: BC Managed Care – PPO | Admitting: Gynecology

## 2013-02-25 ENCOUNTER — Encounter: Payer: Self-pay | Admitting: Women's Health

## 2013-02-25 ENCOUNTER — Ambulatory Visit (INDEPENDENT_AMBULATORY_CARE_PROVIDER_SITE_OTHER): Payer: BC Managed Care – PPO | Admitting: Women's Health

## 2013-02-25 ENCOUNTER — Encounter: Payer: Self-pay | Admitting: Obstetrics and Gynecology

## 2013-02-25 VITALS — BP 130/80 | Ht 63.0 in | Wt 114.0 lb

## 2013-02-25 DIAGNOSIS — E079 Disorder of thyroid, unspecified: Secondary | ICD-10-CM

## 2013-02-25 DIAGNOSIS — Z833 Family history of diabetes mellitus: Secondary | ICD-10-CM

## 2013-02-25 DIAGNOSIS — Z13220 Encounter for screening for lipoid disorders: Secondary | ICD-10-CM

## 2013-02-25 DIAGNOSIS — Z01419 Encounter for gynecological examination (general) (routine) without abnormal findings: Secondary | ICD-10-CM

## 2013-02-25 DIAGNOSIS — Z780 Asymptomatic menopausal state: Secondary | ICD-10-CM

## 2013-02-25 LAB — COMPREHENSIVE METABOLIC PANEL
ALT: 22 U/L (ref 0–35)
AST: 27 U/L (ref 0–37)
Albumin: 4.5 g/dL (ref 3.5–5.2)
Alkaline Phosphatase: 44 U/L (ref 39–117)
Glucose, Bld: 95 mg/dL (ref 70–99)
Potassium: 4.4 mEq/L (ref 3.5–5.3)
Sodium: 139 mEq/L (ref 135–145)
Total Protein: 7 g/dL (ref 6.0–8.3)

## 2013-02-25 LAB — LIPID PANEL
LDL Cholesterol: 75 mg/dL (ref 0–99)
Triglycerides: 51 mg/dL (ref <150)

## 2013-02-25 LAB — CBC WITH DIFFERENTIAL/PLATELET
Basophils Relative: 0 % (ref 0–1)
Hemoglobin: 13.6 g/dL (ref 12.0–15.0)
MCHC: 33.9 g/dL (ref 30.0–36.0)
Monocytes Relative: 8 % (ref 3–12)
Neutro Abs: 2.8 10*3/uL (ref 1.7–7.7)
Neutrophils Relative %: 55 % (ref 43–77)
RBC: 4.37 MIL/uL (ref 3.87–5.11)

## 2013-02-25 NOTE — Patient Instructions (Signed)

## 2013-02-25 NOTE — Progress Notes (Signed)
Melissa [name] 07-Apr-1951 [id number]    History:    The patient presents for annual exam.TVH (fibroids 2002),normal Pap and mammogram history. DEXA 2009 T score -0.4 at the left femoral neck FRAX 5.8%/0.3%. Not sexually active due to husband's health.   Past medical history, past surgical history, family history and social history were all reviewed and documented in the EPIC chart. Scoliosis 3rd degree heart block/pacemaker 2010, followed by Dr. Graciela Husbands,  HTN,hypercholesteremia.  Rt elbow fracture (fall 5/13),colonoscopy/normal, 2007,   Works for [employer]. Husband has erectile dysfunction. 1 son died at 68 from alcohol poisoning. 1 son lives with her (learning impairment but works full time).    ROS:  A  ROS was performed and pertinent positives and negatives are included in the history.  Exam:  Filed Vitals:   02/25/13 0806  BP: 130/80    General appearance:  Severe scoliosis Head/Neck:  Normal, without cervical or supraclavicular adenopathy. Thyroid:  Symmetrical, normal in size, without palpable masses or nodularity. Respiratory  Effort:  Normal  Auscultation:  Clear without wheezing or rhonchi Cardiovascular  Auscultation:  Regular rate, without rubs, murmurs or gallops  Edema/varicosities:  Not grossly evident Abdominal  Soft,nontender, without masses, guarding or rebound.  Liver/spleen:  No organomegaly noted  Hernia:  None appreciated  Skin  Inspection:  Grossly normal  Palpation:  Grossly normal Neurologic/psychiatric  Orientation:  Normal with appropriate conversation.  Mood/affect:  Normal  Genitourinary    Breasts: Examined lying and sitting.     Right: Without masses, retractions, discharge or axillary adenopathy.     Left: Without masses, retractions, discharge or axillary adenopathy.   Inguinal/mons:  Normal without inguinal adenopathy  External genitalia:  Normal  BUS/Urethra/Skene's glands:  Normal  Bladder:  Normal  Vagina:  Atrophic    Cervix:  absent  Uterus:  absent  Adnexa/parametria:     Rt: Without masses or tenderness.   Lt: Without masses or tenderness.  Anus and perineum: Normal  Digital rectal exam: Normal sphincter tone without palpated masses or tenderness  Assessment/Plan:  62 y.o. MWF G2P1  for annual exam.   TVH fibroids/atrophic vaginitis not sexually active Hypertension/hypercholesterolemia/pacemaker-cardiologist manages meds  Plan:Home hemocult card, repeat dexa, Zostravax,continue annual flu vaccine, CBC, TSH, glucose, lipid panel. Lab results to be mailed to patient to give to Dr. Graciela Husbands per request. SBE's, annual mammogram, calcium rich diet, vitamin D 2000 daily. Encouraged increasing regular exercise.  Harrington Challenger [facility], 8:46 AM 02/25/2013

## 2013-02-26 LAB — URINALYSIS W MICROSCOPIC + REFLEX CULTURE
Casts: NONE SEEN
Crystals: NONE SEEN
Leukocytes, UA: NEGATIVE
Nitrite: NEGATIVE
Specific Gravity, Urine: 1.019 (ref 1.005–1.030)
Squamous Epithelial / LPF: NONE SEEN
pH: 7.5 (ref 5.0–8.0)

## 2013-05-31 ENCOUNTER — Encounter: Payer: Self-pay | Admitting: Internal Medicine

## 2013-05-31 ENCOUNTER — Ambulatory Visit (INDEPENDENT_AMBULATORY_CARE_PROVIDER_SITE_OTHER): Payer: BC Managed Care – PPO | Admitting: *Deleted

## 2013-05-31 DIAGNOSIS — I442 Atrioventricular block, complete: Secondary | ICD-10-CM

## 2013-05-31 LAB — PACEMAKER DEVICE OBSERVATION
BAMS-0001: 175 {beats}/min
BATTERY VOLTAGE: 2.78 V
VENTRICULAR PACING PM: 100

## 2013-05-31 NOTE — Progress Notes (Signed)
Pacer check in clinic. Normal device function. No changes made. ROV in February with SK.

## 2013-07-08 ENCOUNTER — Other Ambulatory Visit: Payer: BC Managed Care – PPO | Admitting: Anesthesiology

## 2013-07-08 DIAGNOSIS — K921 Melena: Secondary | ICD-10-CM

## 2013-07-08 DIAGNOSIS — Z1211 Encounter for screening for malignant neoplasm of colon: Secondary | ICD-10-CM

## 2013-07-12 ENCOUNTER — Other Ambulatory Visit: Payer: Self-pay | Admitting: Internal Medicine

## 2013-07-12 ENCOUNTER — Encounter: Payer: Self-pay | Admitting: Gastroenterology

## 2013-07-29 ENCOUNTER — Encounter: Payer: Self-pay | Admitting: *Deleted

## 2013-08-10 ENCOUNTER — Ambulatory Visit: Payer: BC Managed Care – PPO | Admitting: Gastroenterology

## 2013-09-02 ENCOUNTER — Other Ambulatory Visit: Payer: Self-pay

## 2013-11-15 ENCOUNTER — Telehealth: Payer: Self-pay | Admitting: Internal Medicine

## 2013-11-15 NOTE — Telephone Encounter (Signed)
Pt states that she has been reading a few article that reads that Crestor that is a very dangerous medication. The article states that this medication   Affects  The  kidneys and other organs in the body. She would like to change Crestor to other medication. Pt denies  any symptoms with this medication, she is just afraid because different people has recommended   not to take Crestor for all the side effects this medication has listed.

## 2013-11-15 NOTE — Telephone Encounter (Signed)
New Prob   Pt is requesting to be taken off Crestor and put on something different. Please call.

## 2013-11-17 NOTE — Telephone Encounter (Signed)
Left message on personal answering machine explaining she may stop Crestor, and that she and Dr. Caryl Comes will decide at next office visit on 2/9 the plan. Told pt to call back with further questions/concerns.

## 2013-12-06 ENCOUNTER — Encounter: Payer: Self-pay | Admitting: Internal Medicine

## 2013-12-06 ENCOUNTER — Ambulatory Visit (INDEPENDENT_AMBULATORY_CARE_PROVIDER_SITE_OTHER): Payer: BC Managed Care – PPO | Admitting: Internal Medicine

## 2013-12-06 VITALS — BP 117/60 | HR 60 | Ht 63.0 in | Wt 111.4 lb

## 2013-12-06 DIAGNOSIS — I428 Other cardiomyopathies: Secondary | ICD-10-CM

## 2013-12-06 DIAGNOSIS — Z950 Presence of cardiac pacemaker: Secondary | ICD-10-CM

## 2013-12-06 DIAGNOSIS — I442 Atrioventricular block, complete: Secondary | ICD-10-CM

## 2013-12-06 LAB — MDC_IDC_ENUM_SESS_TYPE_INCLINIC
Lead Channel Pacing Threshold Amplitude: 0.75 V
Lead Channel Pacing Threshold Pulse Width: 0.4 ms
Lead Channel Pacing Threshold Pulse Width: 0.4 ms
Lead Channel Setting Pacing Amplitude: 2.5 V
MDC IDC MSMT LEADCHNL RA PACING THRESHOLD AMPLITUDE: 0.5 V
MDC IDC SET LEADCHNL RA PACING AMPLITUDE: 2 V
MDC IDC SET LEADCHNL RV PACING PULSEWIDTH: 0.4 ms
MDC IDC SET LEADCHNL RV SENSING SENSITIVITY: 4 mV

## 2013-12-06 NOTE — Assessment & Plan Note (Signed)
Device dependent. No impairment of exercise tolerance. Stable post pacing

## 2013-12-06 NOTE — Assessment & Plan Note (Signed)
Continue beta blockers and ACE inhibitors. She says her blood work is being checked by her PCP. Assessment of cardiovascular risk is no murmurs 2012 suggest less than 1%; we'll discontinue her Crestor

## 2013-12-06 NOTE — Progress Notes (Signed)
.  sklf      Patient Care Team: Drue Stager. Tammi Klippel, MD as PCP - General (Family Medicine)   HPI  Melissa [name] is a 63 y.o. female  is seen in followup for a pacemaker implanted 201 for complete heart block in setting of mild nonischemic cardiomyopathy, ejection fraction 35-40%. Cardiac catheterization demonstrated nonobstructive coronary disease. Ejection fraction 20-25%. Repeat echo 1/13 EF of 30-35%.   Initiation of carvedilol and ramipril have been associated with improvement in energy  .  The patient denies chest pain, shortness of breath, nocturnal dyspnea, orthopnea or peripheral edema. There have been no palpitations, lightheadedness or syncope.   She brings in a magazine called the stools pills and she wants to whether she should be on Crestor. We went back to the old medical record it was initiated the time of her initial presentation. Using cholesterol measurements from that time her 10 year predicted risk was less than 1%.  Past Medical History  Diagnosis Date  . Nonischemic cardiomyopathy     EF 35% 2013  . Complete heart block     3rd degree  . Syncope     heart rate < normal, passed out   . Anxiety   . Fibroid   . DUB (dysfunctional uterine bleeding)   . Pacemaker  Medtronic     medtronic- placed 2010  . Hypertension     Past Surgical History  Procedure Laterality Date  . Pacemaker insertion  2010  . Vaginal hysterectomy  07-06-01  . Insert / replace / remove pacemaker    . Orif elbow fracture  03/17/2012    Procedure: OPEN REDUCTION INTERNAL FIXATION (ORIF) ELBOW/OLECRANON FRACTURE;  Surgeon: Roseanne Kaufman, MD;  Location: [facility];  Service: Orthopedics;  Laterality: Right;  RIGHT ELBOW ORIF WITH LIGAMENT REPAIR, RECONSTRUCTION  . Colonoscopy  2007    Normal     Current Outpatient Prescriptions  Medication Sig Dispense Refill  . calcium carbonate (OS-CAL) 600 MG TABS Take 600 mg by mouth 2 (two) times daily with a meal.       . carvedilol (COREG) 12.5 MG  tablet TAKE 1 TABLET TWICE A DAY WITH FOOD  180 tablet  3  . CRESTOR 10 MG tablet TAKE 1 TABLET (10 MG TOTAL) BY MOUTH AT BEDTIME.  90 tablet  3  . Multiple Vitamins-Minerals ([employer]S SPECTRAVITE PO) Take 1 tablet by mouth daily.        . ramipril (ALTACE) 5 MG capsule TAKE 1 CAPSULE (5 MG TOTAL) BY MOUTH DAILY.  90 capsule  3   No current facility-administered medications for this visit.    Allergies  Allergen Reactions  . Prednisone     REACTION: GI distress    Review of Systems negative except from HPI and PMH  Physical Exam BP 117/60  Pulse 60  Ht 5\' 3"  (1.6 m)  Wt 111 lb 6.4 oz (50.531 kg)  BMI 19.74 kg/m2 Well developed and well nourished in no acute distress HENT normal E scleral and icterus clear Neck Supple JVP flat; carotids brisk and full Clear to ausculation  Device pocket well healed; without hematoma or erythema.  There is no tethering   Regular rate and rhythm, no murmurs gallops or rub Soft with active bowel sounds No clubbing cyanosis none Edema Alert and oriented, grossly normal motor and sensory function Skin Warm and Dry    Assessment and  Plan

## 2013-12-06 NOTE — Assessment & Plan Note (Signed)
The patient's device was interrogated.  The information was reviewed. No changes were made in the programming.    

## 2013-12-06 NOTE — Patient Instructions (Addendum)
Your physician has recommended you make the following change in your medication:  1) Stop Crestor  Your physician wants you to follow-up in: 6 months with device clinic.  You will receive a reminder letter in the mail two months in advance. If you don't receive a letter, please call our office to schedule the follow-up appointment.  Your physician wants you to follow-up in: 1 year with Ileene Hutchinson, PAC.  You will receive a reminder letter in the mail two months in advance. If you don't receive a letter, please call our office to schedule the follow-up appointment.

## 2013-12-09 ENCOUNTER — Encounter: Payer: Self-pay | Admitting: Internal Medicine

## 2014-02-28 ENCOUNTER — Encounter: Payer: BC Managed Care – PPO | Admitting: Gynecology

## 2014-02-28 DIAGNOSIS — Z0000 Encounter for general adult medical examination without abnormal findings: Secondary | ICD-10-CM | POA: Insufficient documentation

## 2014-02-28 DIAGNOSIS — I442 Atrioventricular block, complete: Secondary | ICD-10-CM | POA: Insufficient documentation

## 2014-02-28 DIAGNOSIS — I10 Essential (primary) hypertension: Secondary | ICD-10-CM | POA: Insufficient documentation

## 2014-06-06 ENCOUNTER — Ambulatory Visit (INDEPENDENT_AMBULATORY_CARE_PROVIDER_SITE_OTHER): Payer: BC Managed Care – PPO | Admitting: *Deleted

## 2014-06-06 DIAGNOSIS — I442 Atrioventricular block, complete: Secondary | ICD-10-CM

## 2014-06-06 DIAGNOSIS — Z950 Presence of cardiac pacemaker: Secondary | ICD-10-CM

## 2014-06-06 LAB — MDC_IDC_ENUM_SESS_TYPE_INCLINIC
Battery Impedance: 515 Ohm
Battery Remaining Longevity: 82 mo
Battery Voltage: 2.77 V
Brady Statistic AP VP Percent: 26 %
Brady Statistic AS VS Percent: 0 %
Date Time Interrogation Session: 20150810121830
Lead Channel Impedance Value: 627 Ohm
Lead Channel Pacing Threshold Pulse Width: 0.4 ms
Lead Channel Pacing Threshold Pulse Width: 0.4 ms
Lead Channel Setting Pacing Amplitude: 2.5 V
Lead Channel Setting Pacing Pulse Width: 0.4 ms
Lead Channel Setting Sensing Sensitivity: 4 mV
MDC IDC MSMT LEADCHNL RA PACING THRESHOLD AMPLITUDE: 0.5 V
MDC IDC MSMT LEADCHNL RA SENSING INTR AMPL: 0.7 mV
MDC IDC MSMT LEADCHNL RV IMPEDANCE VALUE: 491 Ohm
MDC IDC MSMT LEADCHNL RV PACING THRESHOLD AMPLITUDE: 0.5 V
MDC IDC SET LEADCHNL RA PACING AMPLITUDE: 2 V
MDC IDC STAT BRADY AP VS PERCENT: 0 %
MDC IDC STAT BRADY AS VP PERCENT: 74 %

## 2014-06-06 NOTE — Progress Notes (Signed)
Pacemaker check in clinic. Normal device function. Thresholds, sensing, impedances consistent with previous measurements. Device programmed to maximize longevity. No mode switch or high ventricular rates noted. Device programmed at appropriate safety margins. Histogram distribution appropriate for patient activity level. Device programmed to optimize intrinsic conduction. Estimated longevity 7 years. ROV in 6 mths w/SK.

## 2014-06-23 ENCOUNTER — Encounter: Payer: Self-pay | Admitting: Internal Medicine

## 2014-07-05 ENCOUNTER — Other Ambulatory Visit: Payer: Self-pay

## 2014-07-05 MED ORDER — CARVEDILOL 12.5 MG PO TABS: ORAL_TABLET | ORAL | Status: DC

## 2014-07-05 MED ORDER — RAMIPRIL 5 MG PO CAPS: ORAL_CAPSULE | ORAL | Status: DC

## 2014-08-03 ENCOUNTER — Encounter: Payer: Self-pay | Admitting: Gynecology

## 2014-08-29 ENCOUNTER — Encounter: Payer: Self-pay | Admitting: Internal Medicine

## 2014-12-01 ENCOUNTER — Encounter: Payer: Self-pay | Admitting: Gynecology

## 2014-12-06 ENCOUNTER — Ambulatory Visit (INDEPENDENT_AMBULATORY_CARE_PROVIDER_SITE_OTHER): Payer: BC Managed Care – PPO | Admitting: Internal Medicine

## 2014-12-06 ENCOUNTER — Encounter: Payer: Self-pay | Admitting: Internal Medicine

## 2014-12-06 VITALS — BP 126/80 | HR 66 | Ht 64.0 in | Wt 114.4 lb

## 2014-12-06 DIAGNOSIS — Z45018 Encounter for adjustment and management of other part of cardiac pacemaker: Secondary | ICD-10-CM

## 2014-12-06 DIAGNOSIS — I442 Atrioventricular block, complete: Secondary | ICD-10-CM

## 2014-12-06 LAB — MDC_IDC_ENUM_SESS_TYPE_INCLINIC
Battery Impedance: 638 Ohm
Battery Remaining Longevity: 74 mo
Battery Voltage: 2.77 V
Brady Statistic AP VP Percent: 22 %
Brady Statistic AP VS Percent: 0 %
Brady Statistic AS VS Percent: 0 %
Lead Channel Pacing Threshold Pulse Width: 0.4 ms
Lead Channel Sensing Intrinsic Amplitude: 0.7 mV
Lead Channel Setting Pacing Amplitude: 2.5 V
Lead Channel Setting Pacing Pulse Width: 0.4 ms
Lead Channel Setting Sensing Sensitivity: 4 mV
MDC IDC MSMT LEADCHNL RA IMPEDANCE VALUE: 608 Ohm
MDC IDC MSMT LEADCHNL RA PACING THRESHOLD AMPLITUDE: 0.5 V
MDC IDC MSMT LEADCHNL RA PACING THRESHOLD PULSEWIDTH: 0.4 ms
MDC IDC MSMT LEADCHNL RV IMPEDANCE VALUE: 486 Ohm
MDC IDC MSMT LEADCHNL RV PACING THRESHOLD AMPLITUDE: 0.75 V
MDC IDC SESS DTM: 20160209173724
MDC IDC SET LEADCHNL RA PACING AMPLITUDE: 2 V
MDC IDC STAT BRADY AS VP PERCENT: 78 %

## 2014-12-06 NOTE — Progress Notes (Signed)
.  sklf      Patient Care Team: Drue Stager. Tammi Klippel, MD as PCP - General (Family Medicine)   HPI  Melissa [name] is a 64 y.o. female  is seen in followup for a pacemaker implanted  for complete heart block in setting of mild nonischemic cardiomyopathy, ejection fraction 35-40%. Cardiac catheterization demonstrated nonobstructive coronary disease. Ejection fraction 20-25%. Repeat echo 1/13 EF of 30-35%.   Initiation of carvedilol and ramipril have been associated with improvement in energy  . The patient denies chest pain, shortness of breath, nocturnal dyspnea, orthopnea or peripheral edema.  There have been no palpitations, lightheadedness or syncope.      Past Medical History  Diagnosis Date  . Nonischemic cardiomyopathy     EF 35% 2013  . Complete heart block     3rd degree  . Syncope     heart rate < normal, passed out   . Anxiety   . Fibroid   . DUB (dysfunctional uterine bleeding)   . Pacemaker  Medtronic     medtronic- placed 2010  . Hypertension     Past Surgical History  Procedure Laterality Date  . Pacemaker insertion  2010  . Vaginal hysterectomy  07-06-01  . Insert / replace / remove pacemaker    . Orif elbow fracture  03/17/2012    Procedure: OPEN REDUCTION INTERNAL FIXATION (ORIF) ELBOW/OLECRANON FRACTURE;  Surgeon: Roseanne Kaufman, MD;  Location: [facility];  Service: Orthopedics;  Laterality: Right;  RIGHT ELBOW ORIF WITH LIGAMENT REPAIR, RECONSTRUCTION  . Colonoscopy  2007    Normal     Current Outpatient Prescriptions  Medication Sig Dispense Refill  . calcium carbonate (OS-CAL) 600 MG TABS Take 600 mg by mouth 2 (two) times daily with a meal.     . carvedilol (COREG) 12.5 MG tablet TAKE 1 TABLET TWICE A DAY WITH FOOD 180 tablet 3  . Multiple Vitamins-Minerals (CVS SPECTRAVITE PO) Take 1 tablet by mouth daily.      . ramipril (ALTACE) 5 MG capsule TAKE 1 CAPSULE (5 MG TOTAL) BY MOUTH DAILY. 90 capsule 3   No current facility-administered medications for  this visit.    Allergies  Allergen Reactions  . Prednisone     REACTION: GI distress    Review of Systems negative except from HPI and PMH  Physical Exam BP 126/80 mmHg  Pulse 66  Ht 5\' 4"  (1.626 m)  Wt 114 lb 6.4 oz (51.891 kg)  BMI 19.63 kg/m2 Well developed and well nourished in no acute distress HENT normal E scleral and icterus clear Neck Supple JVP flat; carotids brisk and full Clear to ausculation Device pocket well healed; without hematoma or erythema.  There is no tethering Regular rate and rhythm, no murmurs gallops or rub Soft with active bowel sounds No clubbing cyanosis none Edema Alert and oriented, grossly normal motor and sensory function Skin Warm and Dry    Assessment and  Plan  Nonischemic cardio myopathy   complete heart block  Pacemaker-Medtronic The patient's device was interrogated.  The information was reviewed. No changes were made in the programming.    Stable on current meds

## 2014-12-13 ENCOUNTER — Encounter: Payer: Self-pay | Admitting: Internal Medicine

## 2015-03-02 ENCOUNTER — Telehealth: Payer: Self-pay | Admitting: *Deleted

## 2015-03-02 NOTE — Telephone Encounter (Signed)
Pt has Time Suzan Slick phone svc. I ordered [place] adapter. Pt aware it will take 6-8 wks to arrive. Pt will call device clinic to practice a transmission once adapter arrives. Pt aware formal transmission is scheduled 06/07/15.

## 2015-04-20 ENCOUNTER — Ambulatory Visit (INDEPENDENT_AMBULATORY_CARE_PROVIDER_SITE_OTHER): Payer: BC Managed Care – PPO | Admitting: *Deleted

## 2015-04-20 DIAGNOSIS — I442 Atrioventricular block, complete: Secondary | ICD-10-CM

## 2015-04-20 NOTE — Progress Notes (Signed)
Remote pacemaker transmission.   

## 2015-04-21 ENCOUNTER — Encounter: Payer: Self-pay | Admitting: Internal Medicine

## 2015-04-26 LAB — CUP PACEART REMOTE DEVICE CHECK
Battery Remaining Longevity: 65 mo
Battery Voltage: 2.77 V
Brady Statistic AS VP Percent: 78 %
Lead Channel Pacing Threshold Amplitude: 0.875 V
Lead Channel Sensing Intrinsic Amplitude: 0.7 mV
Lead Channel Setting Pacing Amplitude: 2.5 V
Lead Channel Setting Pacing Pulse Width: 0.4 ms
MDC IDC MSMT BATTERY IMPEDANCE: 814 Ohm
MDC IDC MSMT LEADCHNL RA IMPEDANCE VALUE: 617 Ohm
MDC IDC MSMT LEADCHNL RA PACING THRESHOLD AMPLITUDE: 0.875 V
MDC IDC MSMT LEADCHNL RA PACING THRESHOLD PULSEWIDTH: 0.4 ms
MDC IDC MSMT LEADCHNL RV IMPEDANCE VALUE: 481 Ohm
MDC IDC MSMT LEADCHNL RV PACING THRESHOLD PULSEWIDTH: 0.4 ms
MDC IDC SESS DTM: 20160623092623
MDC IDC SET LEADCHNL RA PACING AMPLITUDE: 2 V
MDC IDC SET LEADCHNL RV SENSING SENSITIVITY: 4 mV
MDC IDC STAT BRADY AP VP PERCENT: 22 %
MDC IDC STAT BRADY AP VS PERCENT: 0 %
MDC IDC STAT BRADY AS VS PERCENT: 0 %

## 2015-06-02 ENCOUNTER — Encounter: Payer: Self-pay | Admitting: Cardiology

## 2015-06-09 ENCOUNTER — Encounter: Payer: Self-pay | Admitting: Internal Medicine

## 2015-06-30 ENCOUNTER — Telehealth: Payer: Self-pay | Admitting: Internal Medicine

## 2015-06-30 NOTE — Telephone Encounter (Signed)
New message     Pt states she is running out of her medications and needs prior authorization for ramipril and coreg

## 2015-07-04 ENCOUNTER — Other Ambulatory Visit: Payer: Self-pay

## 2015-07-04 MED ORDER — RAMIPRIL 5 MG PO CAPS: ORAL_CAPSULE | ORAL | Status: DC

## 2015-07-04 MED ORDER — CARVEDILOL 12.5 MG PO TABS: ORAL_TABLET | ORAL | Status: DC

## 2015-07-04 NOTE — Telephone Encounter (Signed)
Sent to [place].

## 2015-07-20 ENCOUNTER — Ambulatory Visit (INDEPENDENT_AMBULATORY_CARE_PROVIDER_SITE_OTHER): Payer: BC Managed Care – PPO | Admitting: *Deleted

## 2015-07-20 ENCOUNTER — Telehealth: Payer: Self-pay | Admitting: Internal Medicine

## 2015-07-20 DIAGNOSIS — I442 Atrioventricular block, complete: Secondary | ICD-10-CM | POA: Diagnosis not present

## 2015-07-20 NOTE — Telephone Encounter (Signed)
Follow up      Did we get her remote transmission from this am?

## 2015-07-20 NOTE — Telephone Encounter (Signed)
Informed pt that we did receive her transmission. Pt verbalized understanding.

## 2015-07-20 NOTE — Telephone Encounter (Signed)
New message ° ° ° ° ° °Did you get her remote transmission? °

## 2015-07-20 NOTE — Progress Notes (Signed)
Remote pacemaker transmission.   

## 2015-07-24 LAB — CUP PACEART REMOTE DEVICE CHECK
Battery Impedance: 865 Ohm
Battery Remaining Longevity: 63 mo
Battery Voltage: 2.77 V
Brady Statistic AP VP Percent: 23 %
Brady Statistic AP VS Percent: 0 %
Brady Statistic AS VP Percent: 77 %
Date Time Interrogation Session: 20160922080547
Lead Channel Setting Pacing Amplitude: 2 V
Lead Channel Setting Pacing Pulse Width: 0.4 ms
Lead Channel Setting Sensing Sensitivity: 4 mV
MDC IDC MSMT LEADCHNL RA IMPEDANCE VALUE: 607 Ohm
MDC IDC MSMT LEADCHNL RV IMPEDANCE VALUE: 484 Ohm
MDC IDC SET LEADCHNL RV PACING AMPLITUDE: 2.5 V
MDC IDC STAT BRADY AS VS PERCENT: 0 %

## 2015-08-08 ENCOUNTER — Encounter: Payer: Self-pay | Admitting: Cardiology

## 2015-08-17 ENCOUNTER — Encounter: Payer: Self-pay | Admitting: Internal Medicine

## 2015-10-24 ENCOUNTER — Ambulatory Visit (INDEPENDENT_AMBULATORY_CARE_PROVIDER_SITE_OTHER): Payer: BC Managed Care – PPO | Admitting: *Deleted

## 2015-10-24 DIAGNOSIS — I442 Atrioventricular block, complete: Secondary | ICD-10-CM

## 2015-10-25 NOTE — Progress Notes (Signed)
Remote pacemaker transmission.   

## 2015-10-26 ENCOUNTER — Encounter: Payer: Self-pay | Admitting: Cardiology

## 2015-10-26 LAB — CUP PACEART REMOTE DEVICE CHECK
Battery Impedance: 994 Ohm
Battery Remaining Longevity: 58 mo
Brady Statistic AP VP Percent: 22 %
Brady Statistic AP VS Percent: 0 %
Brady Statistic AS VP Percent: 78 %
Date Time Interrogation Session: 20161227105533
Implantable Lead Implant Date: 20100701
Implantable Lead Implant Date: 20100701
Implantable Lead Location: 753860
Implantable Lead Model: 5076
Lead Channel Impedance Value: 483 Ohm
Lead Channel Pacing Threshold Amplitude: 0.625 V
Lead Channel Pacing Threshold Pulse Width: 0.4 ms
Lead Channel Sensing Intrinsic Amplitude: 0.7 mV
Lead Channel Setting Pacing Amplitude: 2 V
Lead Channel Setting Pacing Pulse Width: 0.4 ms
Lead Channel Setting Sensing Sensitivity: 4 mV
MDC IDC LEAD LOCATION: 753859
MDC IDC MSMT BATTERY VOLTAGE: 2.76 V
MDC IDC MSMT LEADCHNL RA IMPEDANCE VALUE: 628 Ohm
MDC IDC MSMT LEADCHNL RA PACING THRESHOLD AMPLITUDE: 0.375 V
MDC IDC MSMT LEADCHNL RA PACING THRESHOLD PULSEWIDTH: 0.4 ms
MDC IDC SET LEADCHNL RV PACING AMPLITUDE: 2.5 V
MDC IDC STAT BRADY AS VS PERCENT: 0 %

## 2015-12-18 ENCOUNTER — Encounter: Payer: BC Managed Care – PPO | Admitting: Internal Medicine

## 2015-12-19 ENCOUNTER — Ambulatory Visit (INDEPENDENT_AMBULATORY_CARE_PROVIDER_SITE_OTHER): Payer: BC Managed Care – PPO | Admitting: Internal Medicine

## 2015-12-19 ENCOUNTER — Encounter: Payer: Self-pay | Admitting: Internal Medicine

## 2015-12-19 ENCOUNTER — Ambulatory Visit: Payer: BC Managed Care – PPO | Admitting: Internal Medicine

## 2015-12-19 VITALS — BP 124/80 | HR 79 | Ht 63.0 in | Wt 114.6 lb

## 2015-12-19 DIAGNOSIS — Z950 Presence of cardiac pacemaker: Secondary | ICD-10-CM

## 2015-12-19 DIAGNOSIS — I429 Cardiomyopathy, unspecified: Secondary | ICD-10-CM | POA: Diagnosis not present

## 2015-12-19 DIAGNOSIS — I442 Atrioventricular block, complete: Secondary | ICD-10-CM

## 2015-12-19 DIAGNOSIS — I428 Other cardiomyopathies: Secondary | ICD-10-CM

## 2015-12-19 LAB — CUP PACEART INCLINIC DEVICE CHECK
Battery Impedance: 940 Ohm
Battery Remaining Longevity: 60 mo
Battery Voltage: 2.76 V
Implantable Lead Implant Date: 20100701
Implantable Lead Location: 753859
Implantable Lead Model: 5076
Lead Channel Impedance Value: 608 Ohm
Lead Channel Pacing Threshold Amplitude: 0.5 V
Lead Channel Pacing Threshold Amplitude: 0.5 V
Lead Channel Pacing Threshold Amplitude: 1 V
Lead Channel Pacing Threshold Pulse Width: 0.4 ms
Lead Channel Pacing Threshold Pulse Width: 0.4 ms
Lead Channel Sensing Intrinsic Amplitude: 0.7 mV
Lead Channel Setting Pacing Amplitude: 2 V
MDC IDC LEAD IMPLANT DT: 20100701
MDC IDC LEAD LOCATION: 753860
MDC IDC MSMT LEADCHNL RA PACING THRESHOLD PULSEWIDTH: 0.4 ms
MDC IDC MSMT LEADCHNL RV IMPEDANCE VALUE: 476 Ohm
MDC IDC MSMT LEADCHNL RV PACING THRESHOLD AMPLITUDE: 0.625 V
MDC IDC MSMT LEADCHNL RV PACING THRESHOLD PULSEWIDTH: 0.4 ms
MDC IDC SESS DTM: 20170221211654
MDC IDC SET LEADCHNL RV PACING AMPLITUDE: 2.5 V
MDC IDC SET LEADCHNL RV PACING PULSEWIDTH: 0.4 ms
MDC IDC SET LEADCHNL RV SENSING SENSITIVITY: 4 mV
MDC IDC STAT BRADY AP VP PERCENT: 22 %
MDC IDC STAT BRADY AP VS PERCENT: 0 %
MDC IDC STAT BRADY AS VP PERCENT: 78 %
MDC IDC STAT BRADY AS VS PERCENT: 0 %

## 2015-12-19 NOTE — Patient Instructions (Signed)
Medication Instructions: - Your physician recommends that you continue on your current medications as directed. Please refer to the Current Medication list given to you today.  Labwork: - none  Procedures/Testing: - none  Follow-Up: - Remote monitoring is used to monitor your Pacemaker of ICD from home. This monitoring reduces the number of office visits required to check your device to one time per year. It allows [place] to keep an eye on the functioning of your device to ensure it is working properly. You are scheduled for a device check from home on 03/19/16. You may send your transmission at any time that day. If you have a wireless device, the transmission will be sent automatically. After your physician reviews your transmission, you will receive a postcard with your next transmission date.  - Your physician wants you to follow-up in: 1 year with Dr. Caryl Comes. You will receive a reminder letter in the mail two months in advance. If you don't receive a letter, please call our office to schedule the follow-up appointment.  Any Additional Special Instructions Will Be Listed Below (If Applicable).     If you need a refill on your cardiac medications before your next appointment, please call your pharmacy.

## 2015-12-19 NOTE — Progress Notes (Signed)
.  sklf      Patient Care Team: Barrie Lyme, FNP as PCP - General (Nurse Practitioner)   HPI  Melissa [name] is a 65 y.o. female is seen in followup for a pacemaker implanted  for complete heart block in setting of mild nonischemic cardiomyopathy, ejection fraction 35-40%. Cardiac catheterization demonstrated nonobstructive coronary disease. Ejection fraction 20-25%. Repeat echo 1/13 EF of 30-35%.   Initiation of carvedilol and ramipril have been associated with improvement in energy    . The patient denies chest pain, shortness of breath, nocturnal dyspnea, orthopnea or peripheral edema.  There have been no palpitations, lightheadedness or syncope.    she loves walking the dog  Past Medical History  Diagnosis Date  . Nonischemic cardiomyopathy ([place])     EF 35% 2013  . Complete heart block (HCC)     3rd degree  . Syncope     heart rate < normal, passed out   . Anxiety   . Fibroid   . DUB (dysfunctional uterine bleeding)   . Pacemaker  Medtronic     medtronic- placed 2010  . Hypertension     Past Surgical History  Procedure Laterality Date  . Pacemaker insertion  2010  . Vaginal hysterectomy  07-06-01  . Insert / replace / remove pacemaker    . Orif elbow fracture  03/17/2012    Procedure: OPEN REDUCTION INTERNAL FIXATION (ORIF) ELBOW/OLECRANON FRACTURE;  Surgeon: Roseanne Kaufman, MD;  Location: [place];  Service: Orthopedics;  Laterality: Right;  RIGHT ELBOW ORIF WITH LIGAMENT REPAIR, RECONSTRUCTION  . Colonoscopy  2007    Normal     Current Outpatient Prescriptions  Medication Sig Dispense Refill  . calcium carbonate (OS-CAL) 600 MG TABS Take 600 mg by mouth 2 (two) times daily with a meal.     . carvedilol (COREG) 12.5 MG tablet TAKE 1 TABLET TWICE A DAY WITH FOOD 180 tablet 1  . Multiple Vitamins-Minerals ([employer] SPECTRAVITE PO) Take 1 tablet by mouth daily.      . ramipril (ALTACE) 5 MG capsule TAKE 1 CAPSULE (5 MG TOTAL) BY MOUTH DAILY. 90 capsule 1   No current  facility-administered medications for this visit.    Allergies  Allergen Reactions  . Prednisone     REACTION: GI distress    Review of Systems negative except from HPI and PMH  Physical Exam BP 124/80 mmHg  Pulse 79  Ht 5\' 3"  (1.6 m)  Wt 114 lb 9.6 oz (51.982 kg)  BMI 20.31 kg/m2 Well developed and well nourished in no acute distress HENT normal E scleral and icterus clear Neck Supple JVP flat; carotids brisk and full Clear to ausculation Device pocket well healed; without hematoma or erythema.  There is no tethering Regular rate and rhythm, no murmurs gallops or rub Soft with active bowel sounds No clubbing cyanosis none Edema Alert and oriented, grossly normal motor and sensory function Skin Warm and Dry    Assessment and  Plan  Nonischemic cardio myopathy   complete heart block  Pacemaker-Medtronic The patient's device was interrogated.  The information was reviewed. No changes were made in the programming.    Stable on current meds  Euvolemic continue current meds

## 2015-12-24 ENCOUNTER — Other Ambulatory Visit: Payer: Self-pay | Admitting: Internal Medicine

## 2016-03-19 ENCOUNTER — Ambulatory Visit (INDEPENDENT_AMBULATORY_CARE_PROVIDER_SITE_OTHER): Payer: BC Managed Care – PPO | Admitting: *Deleted

## 2016-03-19 DIAGNOSIS — I442 Atrioventricular block, complete: Secondary | ICD-10-CM

## 2016-03-19 NOTE — Progress Notes (Signed)
Remote pacemaker transmission.   

## 2016-04-07 LAB — CUP PACEART REMOTE DEVICE CHECK
Battery Voltage: 2.76 V
Brady Statistic AP VS Percent: 0 %
Date Time Interrogation Session: 20170523082359
Implantable Lead Implant Date: 20100701
Implantable Lead Model: 5076
Lead Channel Pacing Threshold Amplitude: 0.625 V
Lead Channel Pacing Threshold Pulse Width: 0.4 ms
Lead Channel Setting Pacing Amplitude: 2 V
Lead Channel Setting Pacing Amplitude: 2.5 V
Lead Channel Setting Sensing Sensitivity: 4 mV
MDC IDC LEAD IMPLANT DT: 20100701
MDC IDC LEAD LOCATION: 753859
MDC IDC LEAD LOCATION: 753860
MDC IDC MSMT BATTERY IMPEDANCE: 1150 Ohm
MDC IDC MSMT BATTERY REMAINING LONGEVITY: 53 mo
MDC IDC MSMT LEADCHNL RA IMPEDANCE VALUE: 627 Ohm
MDC IDC MSMT LEADCHNL RA PACING THRESHOLD AMPLITUDE: 0.375 V
MDC IDC MSMT LEADCHNL RA PACING THRESHOLD PULSEWIDTH: 0.4 ms
MDC IDC MSMT LEADCHNL RA SENSING INTR AMPL: 0.7 mV
MDC IDC MSMT LEADCHNL RV IMPEDANCE VALUE: 508 Ohm
MDC IDC SET LEADCHNL RV PACING PULSEWIDTH: 0.4 ms
MDC IDC STAT BRADY AP VP PERCENT: 18 %
MDC IDC STAT BRADY AS VP PERCENT: 82 %
MDC IDC STAT BRADY AS VS PERCENT: 0 %

## 2016-04-16 ENCOUNTER — Encounter: Payer: Self-pay | Admitting: Cardiology

## 2016-06-11 ENCOUNTER — Telehealth: Payer: Self-pay | Admitting: Cardiology
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
## 2016-06-12 NOTE — Telephone Encounter (Signed)
Transmission received. Normal device function. No episodes. Melissa [name] made aware. She verbalizes understanding.

## 2016-06-18 ENCOUNTER — Ambulatory Visit (INDEPENDENT_AMBULATORY_CARE_PROVIDER_SITE_OTHER): Payer: BC Managed Care – PPO | Admitting: *Deleted

## 2016-06-18 DIAGNOSIS — I442 Atrioventricular block, complete: Secondary | ICD-10-CM | POA: Diagnosis not present

## 2016-06-18 DIAGNOSIS — Z950 Presence of cardiac pacemaker: Secondary | ICD-10-CM

## 2016-06-18 NOTE — Progress Notes (Signed)
Remote pacemaker transmission.   

## 2016-06-26 ENCOUNTER — Encounter: Payer: Self-pay | Admitting: Cardiology

## 2016-06-27 LAB — CUP PACEART REMOTE DEVICE CHECK
Battery Impedance: 1175 Ohm
Battery Remaining Longevity: 52 mo
Battery Voltage: 2.77 V
Brady Statistic AP VS Percent: 0 %
Implantable Lead Implant Date: 20100701
Implantable Lead Location: 753859
Implantable Lead Model: 5076
Implantable Lead Model: 5076
Lead Channel Impedance Value: 608 Ohm
Lead Channel Pacing Threshold Pulse Width: 0.4 ms
Lead Channel Setting Pacing Amplitude: 2 V
Lead Channel Setting Pacing Amplitude: 2.5 V
MDC IDC LEAD IMPLANT DT: 20100701
MDC IDC LEAD LOCATION: 753860
MDC IDC MSMT LEADCHNL RA PACING THRESHOLD AMPLITUDE: 0.5 V
MDC IDC MSMT LEADCHNL RA SENSING INTR AMPL: 0.7 mV
MDC IDC MSMT LEADCHNL RV IMPEDANCE VALUE: 488 Ohm
MDC IDC MSMT LEADCHNL RV PACING THRESHOLD AMPLITUDE: 0.75 V
MDC IDC MSMT LEADCHNL RV PACING THRESHOLD PULSEWIDTH: 0.4 ms
MDC IDC SESS DTM: 20170822092105
MDC IDC SET LEADCHNL RV PACING PULSEWIDTH: 0.4 ms
MDC IDC SET LEADCHNL RV SENSING SENSITIVITY: 4 mV
MDC IDC STAT BRADY AP VP PERCENT: 21 %
MDC IDC STAT BRADY AS VP PERCENT: 79 %
MDC IDC STAT BRADY AS VS PERCENT: 0 %

## 2016-09-17 ENCOUNTER — Encounter: Payer: BC Managed Care – PPO | Admitting: *Deleted

## 2016-09-18 ENCOUNTER — Encounter: Payer: Self-pay | Admitting: Cardiology

## 2016-09-19 ENCOUNTER — Encounter: Payer: Self-pay | Admitting: Internal Medicine

## 2016-09-21 ENCOUNTER — Encounter: Payer: Self-pay | Admitting: Internal Medicine

## 2016-09-22 ENCOUNTER — Encounter: Payer: Self-pay | Admitting: Internal Medicine

## 2016-09-23 ENCOUNTER — Encounter: Payer: Self-pay | Admitting: Internal Medicine

## 2016-09-23 ENCOUNTER — Ambulatory Visit (INDEPENDENT_AMBULATORY_CARE_PROVIDER_SITE_OTHER): Payer: BC Managed Care – PPO | Admitting: *Deleted

## 2016-09-23 DIAGNOSIS — I442 Atrioventricular block, complete: Secondary | ICD-10-CM | POA: Diagnosis not present

## 2016-09-25 NOTE — Progress Notes (Signed)
Remote pacemaker transmission.   

## 2016-09-26 ENCOUNTER — Encounter: Payer: Self-pay | Admitting: Cardiology

## 2016-10-11 LAB — CUP PACEART REMOTE DEVICE CHECK
Battery Remaining Longevity: 50 mo
Brady Statistic AS VS Percent: 0 %
Implantable Lead Implant Date: 20100701
Lead Channel Impedance Value: 506 Ohm
Lead Channel Pacing Threshold Amplitude: 0.75 V
Lead Channel Pacing Threshold Pulse Width: 0.4 ms
Lead Channel Setting Pacing Amplitude: 2 V
Lead Channel Setting Sensing Sensitivity: 4 mV
MDC IDC LEAD IMPLANT DT: 20100701
MDC IDC LEAD LOCATION: 753859
MDC IDC LEAD LOCATION: 753860
MDC IDC MSMT BATTERY IMPEDANCE: 1282 Ohm
MDC IDC MSMT BATTERY VOLTAGE: 2.76 V
MDC IDC MSMT LEADCHNL RA IMPEDANCE VALUE: 618 Ohm
MDC IDC MSMT LEADCHNL RA PACING THRESHOLD AMPLITUDE: 0.5 V
MDC IDC MSMT LEADCHNL RA PACING THRESHOLD PULSEWIDTH: 0.4 ms
MDC IDC PG IMPLANT DT: 20100701
MDC IDC SESS DTM: 20171125173717
MDC IDC SET LEADCHNL RV PACING AMPLITUDE: 2.5 V
MDC IDC SET LEADCHNL RV PACING PULSEWIDTH: 0.4 ms
MDC IDC STAT BRADY AP VP PERCENT: 22 %
MDC IDC STAT BRADY AP VS PERCENT: 0 %
MDC IDC STAT BRADY AS VP PERCENT: 78 %

## 2016-11-28 ENCOUNTER — Ambulatory Visit (INDEPENDENT_AMBULATORY_CARE_PROVIDER_SITE_OTHER): Payer: BC Managed Care – PPO | Admitting: Internal Medicine

## 2016-11-28 ENCOUNTER — Encounter: Payer: Self-pay | Admitting: Internal Medicine

## 2016-11-28 VITALS — BP 112/84 | HR 78 | Ht 63.0 in | Wt 115.0 lb

## 2016-11-28 DIAGNOSIS — Z950 Presence of cardiac pacemaker: Secondary | ICD-10-CM | POA: Diagnosis not present

## 2016-11-28 DIAGNOSIS — I442 Atrioventricular block, complete: Secondary | ICD-10-CM | POA: Diagnosis not present

## 2016-11-28 DIAGNOSIS — I428 Other cardiomyopathies: Secondary | ICD-10-CM

## 2016-11-28 DIAGNOSIS — Z79899 Other long term (current) drug therapy: Secondary | ICD-10-CM | POA: Diagnosis not present

## 2016-11-28 LAB — CUP PACEART INCLINIC DEVICE CHECK
Battery Impedance: 1282 Ohm
Battery Voltage: 2.76 V
Implantable Lead Implant Date: 20100701
Implantable Lead Location: 753859
Implantable Lead Model: 5076
Lead Channel Impedance Value: 618 Ohm
Lead Channel Pacing Threshold Pulse Width: 0.4 ms
Lead Channel Sensing Intrinsic Amplitude: 0.7 mV
Lead Channel Setting Pacing Amplitude: 2 V
Lead Channel Setting Pacing Amplitude: 2.5 V
MDC IDC LEAD IMPLANT DT: 20100701
MDC IDC LEAD LOCATION: 753860
MDC IDC MSMT BATTERY REMAINING LONGEVITY: 49 mo
MDC IDC MSMT LEADCHNL RA PACING THRESHOLD AMPLITUDE: 0.5 V
MDC IDC MSMT LEADCHNL RV IMPEDANCE VALUE: 487 Ohm
MDC IDC MSMT LEADCHNL RV PACING THRESHOLD AMPLITUDE: 0.75 V
MDC IDC MSMT LEADCHNL RV PACING THRESHOLD PULSEWIDTH: 0.4 ms
MDC IDC PG IMPLANT DT: 20100701
MDC IDC SESS DTM: 20180201170803
MDC IDC SET LEADCHNL RV PACING PULSEWIDTH: 0.4 ms
MDC IDC SET LEADCHNL RV SENSING SENSITIVITY: 4 mV
MDC IDC STAT BRADY AP VP PERCENT: 21 %
MDC IDC STAT BRADY AP VS PERCENT: 0 %
MDC IDC STAT BRADY AS VP PERCENT: 79 %
MDC IDC STAT BRADY AS VS PERCENT: 0 %

## 2016-11-28 MED ORDER — CARVEDILOL 12.5 MG PO TABS: 12.5000 mg | ORAL_TABLET | Freq: Two times a day (BID) | ORAL | 3 refills | Status: DC

## 2016-11-28 MED ORDER — RAMIPRIL 5 MG PO CAPS: ORAL_CAPSULE | ORAL | 3 refills | Status: DC

## 2016-11-28 NOTE — Patient Instructions (Signed)
Medication Instructions:    Your physician recommends that you continue on your current medications as directed. Please refer to the Current Medication list given to you today.  --- If you need a refill on your cardiac medications before your next appointment, please call your pharmacy. ---  Labwork:  BMET today  Testing/Procedures: Your physician has requested that you have an echocardiogram. Echocardiography is a painless test that uses sound waves to create images of your heart. It provides your doctor with information about the size and shape of your heart and how well your heart's chambers and valves are working. This procedure takes approximately one hour. There are no restrictions for this procedure.  Follow-Up: Remote monitoring is used to monitor your Pacemaker of ICD from home. This monitoring reduces the number of office visits required to check your device to one time per year. It allows [place] to keep an eye on the functioning of your device to ensure it is working properly. You are scheduled for a device check from home on 02/26/2017. You may send your transmission at any time that day. If you have a wireless device, the transmission will be sent automatically. After your physician reviews your transmission, you will receive a postcard with your next transmission date.   Your physician wants you to follow-up in: 1 year with Dr. Caryl Comes.  You will receive a reminder letter in the mail two months in advance. If you don't receive a letter, please call our office to schedule the follow-up appointment.  Thank you for choosing [facility]!!

## 2016-11-28 NOTE — Progress Notes (Signed)
.  sklf      Patient Care Team: Barrie Lyme, FNP as PCP - General (Nurse Practitioner)   HPI  Melissa [name] is a 66 y.o. female is seen in followup for a pacemaker implanted  for complete heart block in setting of mild nonischemic cardiomyopathy, ejection fraction 35-40%. Cardiac catheterization demonstrated nonobstructive coronary disease. Ejection fraction 20-25%. Repeat echo 1/13 EF of 30-35%.   Initiation of carvedilol and ramipril have been associated with improvement in energy   The patient denies chest pain, shortness of breath, nocturnal dyspnea, orthopnea or peripheral edema.  There have been no palpitations, lightheadedness or syncope.    she loves walking the dog  Past Medical History:  Diagnosis Date  . Anxiety   . 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 DUB (dysfunctional uterine bleeding)   . Fibroid   . Hypertension   . Nonischemic cardiomyopathy ([place])    EF 35% 2013  . Pacemaker  Medtronic    medtronic- placed 2010  . Syncope    heart rate < normal, passed out     Past Surgical History:  Procedure Laterality Date  . COLONOSCOPY  2007   Normal   . INSERT / REPLACE / REMOVE PACEMAKER    . ORIF ELBOW FRACTURE  03/17/2012   Procedure: OPEN REDUCTION INTERNAL FIXATION (ORIF) ELBOW/OLECRANON FRACTURE;  Surgeon: Roseanne Kaufman, MD;  Location: [place];  Service: Orthopedics;  Laterality: Right;  RIGHT ELBOW ORIF WITH LIGAMENT REPAIR, RECONSTRUCTION  . PACEMAKER INSERTION  2010  . VAGINAL HYSTERECTOMY  07-06-01    Current Outpatient Prescriptions  Medication Sig Dispense Refill  . calcium carbonate (OS-CAL) 600 MG TABS Take 600 mg by mouth 2 (two) times daily with a meal.     . carvedilol (COREG) 12.5 MG tablet TAKE 1 TABLET TWICE A DAY WITH FOOD 180 tablet 3  . Multiple Vitamins-Minerals ([employer] SPECTRAVITE PO) Take 1 tablet by mouth daily.      . ramipril (ALTACE) 5 MG capsule TAKE 1 CAPSULE (5 MG TOTAL) BY MOUTH DAILY. 90 capsule 3   No current  facility-administered medications for this visit.     Allergies  Allergen Reactions  . Prednisone     REACTION: GI distress    Review of Systems negative except from HPI and PMH  Physical Exam BP 112/84   Pulse 78   Ht 5\' 3"  (1.6 m)   Wt 115 lb (52.2 kg)   SpO2 96%   BMI 20.37 kg/m  Well developed and well nourished in no acute distress HENT normal E scleral and icterus clear Neck Supple JVP flat; carotids brisk and full without bruit  Clear to ausculation Device pocket well healed; without hematoma or erythema.  There is no tethering Regular rate and rhythm, no murmurs gallops or rub Soft with active bowel sounds No clubbing cyanosis none Edema Alert and oriented, grossly normal motor and sensory function Skin Warm and Dry  ECG demonstrates sinus rhythm with PVCs synchronous pacing 71    19/16/45   Assessment and  Plan  Nonischemic cardiomyopathy   complete heart block  CHF class 2    Pacemaker-Medtronic The patient's device was interrogated.  The information was reviewed. No changes were made in the programming.    Stable on current meds  We will check electrolytes on meds Repeat echo as 5 years  Euvolemic continue current meds

## 2016-12-02 ENCOUNTER — Telehealth: Payer: Self-pay | Admitting: *Deleted

## 2016-12-02 NOTE — Telephone Encounter (Signed)
Pt seen on 2/1 w/ Dr. Caryl Comes.  She was supposed to have medication surveillance lab before leaving  -- for some reason this was not completed. Scheduled pt for 3/15 (same day as echo) for BMET. Patient verbalized understanding and agreeable to plan.

## 2016-12-12 ENCOUNTER — Other Ambulatory Visit: Payer: Self-pay

## 2016-12-12 ENCOUNTER — Other Ambulatory Visit (INDEPENDENT_AMBULATORY_CARE_PROVIDER_SITE_OTHER): Payer: BC Managed Care – PPO

## 2016-12-12 ENCOUNTER — Ambulatory Visit (HOSPITAL_COMMUNITY): Payer: BC Managed Care – PPO | Attending: Cardiovascular Disease

## 2016-12-12 DIAGNOSIS — I442 Atrioventricular block, complete: Secondary | ICD-10-CM | POA: Diagnosis not present

## 2016-12-12 DIAGNOSIS — I428 Other cardiomyopathies: Secondary | ICD-10-CM | POA: Diagnosis not present

## 2016-12-12 DIAGNOSIS — I351 Nonrheumatic aortic (valve) insufficiency: Secondary | ICD-10-CM | POA: Insufficient documentation

## 2016-12-12 LAB — ECHOCARDIOGRAM COMPLETE
AVPHT: 541 ms
CHL CUP DOP CALC LVOT VTI: 19.1 cm
CHL CUP TV REG PEAK VELOCITY: 221 cm/s
E decel time: 239 msec
EERAT: 7.92
FS: 23 % — AB (ref 28–44)
IVS/LV PW RATIO, ED: 0.7
LA diam end sys: 29 mm
LA diam index: 1.9 cm/m2
LA vol index: 22.9 mL/m2
LA vol: 35 mL
LASIZE: 29 mm
LAVOLA4C: 29 mL
LV E/e'average: 7.92
LV PW d: 9.22 mm — AB (ref 0.6–1.1)
LV e' LATERAL: 7.35 cm/s
LVEEMED: 7.92
LVOT SV: 60 mL
LVOT area: 3.14 cm2
LVOT diameter: 20 mm
LVOT peak vel: 94 cm/s
MV Dec: 239
MVPKAVEL: 74 m/s
MVPKEVEL: 58.2 m/s
RV LATERAL S' VELOCITY: 11.8 cm/s
RV sys press: 23 mmHg
TDI e' lateral: 7.35
TDI e' medial: 7.35
TRMAXVEL: 221 cm/s
TVPG: 221 mmHg

## 2016-12-12 LAB — BASIC METABOLIC PANEL
BUN/Creatinine Ratio: 27 (ref 12–28)
BUN: 19 mg/dL (ref 8–27)
CO2: 26 mmol/L (ref 18–29)
Calcium: 9.4 mg/dL (ref 8.7–10.3)
Chloride: 102 mmol/L (ref 96–106)
Creatinine, Ser: 0.71 mg/dL (ref 0.57–1.00)
GFR calc Af Amer: 103 mL/min/{1.73_m2} (ref >59)
GFR, EST NON AFRICAN AMERICAN: 89 mL/min/{1.73_m2} (ref >59)
Glucose: 92 mg/dL (ref 65–99)
Potassium: 4.3 mmol/L (ref 3.5–5.2)
SODIUM: 142 mmol/L (ref 134–144)

## 2016-12-13 ENCOUNTER — Telehealth: Payer: Self-pay | Admitting: Internal Medicine

## 2016-12-13 NOTE — Telephone Encounter (Signed)
New Message     Do you have the echo results back yet ?

## 2016-12-13 NOTE — Telephone Encounter (Signed)
I left a message of results on the patient's identified voice mail.

## 2017-02-27 ENCOUNTER — Encounter: Payer: Self-pay | Admitting: Internal Medicine

## 2017-02-27 ENCOUNTER — Ambulatory Visit (INDEPENDENT_AMBULATORY_CARE_PROVIDER_SITE_OTHER): Payer: BC Managed Care – PPO | Admitting: *Deleted

## 2017-02-27 DIAGNOSIS — I442 Atrioventricular block, complete: Secondary | ICD-10-CM

## 2017-02-27 NOTE — Progress Notes (Signed)
Remote pacemaker transmission.   

## 2017-02-28 LAB — CUP PACEART REMOTE DEVICE CHECK
Battery Impedance: 1554 Ohm
Brady Statistic AP VP Percent: 20 %
Brady Statistic AP VS Percent: 0 %
Brady Statistic AS VP Percent: 80 %
Brady Statistic AS VS Percent: 0 %
Implantable Lead Implant Date: 20100701
Implantable Lead Location: 753859
Implantable Lead Location: 753860
Implantable Lead Model: 5076
Lead Channel Impedance Value: 487 Ohm
Lead Channel Impedance Value: 608 Ohm
Lead Channel Pacing Threshold Amplitude: 0.5 V
Lead Channel Pacing Threshold Amplitude: 0.75 V
Lead Channel Pacing Threshold Pulse Width: 0.4 ms
Lead Channel Setting Pacing Amplitude: 2 V
Lead Channel Setting Pacing Amplitude: 2.5 V
MDC IDC LEAD IMPLANT DT: 20100701
MDC IDC MSMT BATTERY REMAINING LONGEVITY: 42 mo
MDC IDC MSMT BATTERY VOLTAGE: 2.76 V
MDC IDC MSMT LEADCHNL RV PACING THRESHOLD PULSEWIDTH: 0.4 ms
MDC IDC PG IMPLANT DT: 20100701
MDC IDC SESS DTM: 20180503084321
MDC IDC SET LEADCHNL RV PACING PULSEWIDTH: 0.4 ms
MDC IDC SET LEADCHNL RV SENSING SENSITIVITY: 4 mV

## 2017-03-07 ENCOUNTER — Encounter: Payer: Self-pay | Admitting: Cardiology

## 2017-03-21 ENCOUNTER — Encounter: Payer: Self-pay | Admitting: Cardiology

## 2017-05-29 ENCOUNTER — Ambulatory Visit (INDEPENDENT_AMBULATORY_CARE_PROVIDER_SITE_OTHER): Payer: BC Managed Care – PPO | Admitting: *Deleted

## 2017-05-29 DIAGNOSIS — I442 Atrioventricular block, complete: Secondary | ICD-10-CM

## 2017-05-29 NOTE — Progress Notes (Signed)
Remote pacemaker transmission.   

## 2017-05-30 ENCOUNTER — Encounter: Payer: Self-pay | Admitting: Internal Medicine

## 2017-05-30 ENCOUNTER — Encounter: Payer: Self-pay | Admitting: Cardiology

## 2017-07-04 LAB — CUP PACEART REMOTE DEVICE CHECK
Battery Remaining Longevity: 41 mo
Battery Voltage: 2.75 V
Brady Statistic AP VP Percent: 23 %
Brady Statistic AS VP Percent: 77 %
Implantable Lead Implant Date: 20100701
Implantable Lead Location: 753859
Implantable Lead Model: 5076
Implantable Pulse Generator Implant Date: 20100701
Lead Channel Pacing Threshold Amplitude: 0.5 V
Lead Channel Pacing Threshold Amplitude: 0.75 V
Lead Channel Sensing Intrinsic Amplitude: 0.7 mV
Lead Channel Setting Pacing Amplitude: 2.5 V
Lead Channel Setting Sensing Sensitivity: 4 mV
MDC IDC LEAD IMPLANT DT: 20100701
MDC IDC LEAD LOCATION: 753860
MDC IDC MSMT BATTERY IMPEDANCE: 1609 Ohm
MDC IDC MSMT LEADCHNL RA IMPEDANCE VALUE: 609 Ohm
MDC IDC MSMT LEADCHNL RA PACING THRESHOLD PULSEWIDTH: 0.4 ms
MDC IDC MSMT LEADCHNL RV IMPEDANCE VALUE: 487 Ohm
MDC IDC MSMT LEADCHNL RV PACING THRESHOLD PULSEWIDTH: 0.4 ms
MDC IDC SESS DTM: 20180802081535
MDC IDC SET LEADCHNL RA PACING AMPLITUDE: 2 V
MDC IDC SET LEADCHNL RV PACING PULSEWIDTH: 0.4 ms
MDC IDC STAT BRADY AP VS PERCENT: 0 %
MDC IDC STAT BRADY AS VS PERCENT: 0 %

## 2017-07-10 ENCOUNTER — Encounter: Payer: Self-pay | Admitting: Internal Medicine

## 2017-08-28 ENCOUNTER — Ambulatory Visit (INDEPENDENT_AMBULATORY_CARE_PROVIDER_SITE_OTHER): Payer: BC Managed Care – PPO | Admitting: *Deleted

## 2017-08-28 DIAGNOSIS — I442 Atrioventricular block, complete: Secondary | ICD-10-CM | POA: Diagnosis not present

## 2017-08-29 ENCOUNTER — Encounter: Payer: Self-pay | Admitting: Cardiology

## 2017-08-29 NOTE — Progress Notes (Signed)
Remote pacemaker transmission.   

## 2017-09-24 LAB — CUP PACEART REMOTE DEVICE CHECK
Battery Impedance: 1639 Ohm
Brady Statistic AP VS Percent: 0 %
Brady Statistic AS VS Percent: 0 %
Implantable Lead Implant Date: 20100701
Implantable Lead Location: 753860
Implantable Lead Model: 5076
Lead Channel Impedance Value: 475 Ohm
Lead Channel Pacing Threshold Pulse Width: 0.4 ms
Lead Channel Pacing Threshold Pulse Width: 0.4 ms
Lead Channel Setting Pacing Amplitude: 2 V
Lead Channel Setting Sensing Sensitivity: 4 mV
MDC IDC LEAD IMPLANT DT: 20100701
MDC IDC LEAD LOCATION: 753859
MDC IDC MSMT BATTERY REMAINING LONGEVITY: 40 mo
MDC IDC MSMT BATTERY VOLTAGE: 2.75 V
MDC IDC MSMT LEADCHNL RA IMPEDANCE VALUE: 609 Ohm
MDC IDC MSMT LEADCHNL RA PACING THRESHOLD AMPLITUDE: 0.5 V
MDC IDC MSMT LEADCHNL RA SENSING INTR AMPL: 0.7 mV
MDC IDC MSMT LEADCHNL RV PACING THRESHOLD AMPLITUDE: 0.75 V
MDC IDC PG IMPLANT DT: 20100701
MDC IDC SESS DTM: 20181101084612
MDC IDC SET LEADCHNL RV PACING AMPLITUDE: 2.5 V
MDC IDC SET LEADCHNL RV PACING PULSEWIDTH: 0.4 ms
MDC IDC STAT BRADY AP VP PERCENT: 23 %
MDC IDC STAT BRADY AS VP PERCENT: 77 %

## 2017-11-26 ENCOUNTER — Encounter: Payer: Self-pay | Admitting: *Deleted

## 2017-11-27 ENCOUNTER — Ambulatory Visit (INDEPENDENT_AMBULATORY_CARE_PROVIDER_SITE_OTHER): Payer: BC Managed Care – PPO | Admitting: *Deleted

## 2017-11-27 DIAGNOSIS — I442 Atrioventricular block, complete: Secondary | ICD-10-CM

## 2017-11-27 NOTE — Progress Notes (Signed)
Remote pacemaker transmission.   

## 2017-11-28 ENCOUNTER — Encounter: Payer: Self-pay | Admitting: Cardiology

## 2017-12-05 ENCOUNTER — Encounter: Payer: Self-pay | Admitting: Internal Medicine

## 2017-12-05 ENCOUNTER — Ambulatory Visit: Payer: BC Managed Care – PPO | Admitting: Internal Medicine

## 2017-12-05 VITALS — BP 124/80 | HR 68 | Ht 63.0 in | Wt 112.0 lb

## 2017-12-05 DIAGNOSIS — Z950 Presence of cardiac pacemaker: Secondary | ICD-10-CM | POA: Diagnosis not present

## 2017-12-05 DIAGNOSIS — I428 Other cardiomyopathies: Secondary | ICD-10-CM

## 2017-12-05 DIAGNOSIS — I442 Atrioventricular block, complete: Secondary | ICD-10-CM

## 2017-12-05 LAB — CUP PACEART INCLINIC DEVICE CHECK
Battery Remaining Longevity: 37 mo
Battery Voltage: 2.76 V
Brady Statistic AS VS Percent: 0 %
Implantable Lead Implant Date: 20100701
Implantable Lead Location: 753860
Implantable Lead Model: 5076
Implantable Pulse Generator Implant Date: 20100701
Lead Channel Impedance Value: 630 Ohm
Lead Channel Pacing Threshold Amplitude: 0.5 V
Lead Channel Pacing Threshold Amplitude: 0.875 V
Lead Channel Pacing Threshold Pulse Width: 0.4 ms
Lead Channel Pacing Threshold Pulse Width: 0.4 ms
Lead Channel Sensing Intrinsic Amplitude: 0.7 mV
Lead Channel Setting Pacing Amplitude: 2.5 V
Lead Channel Setting Pacing Pulse Width: 0.4 ms
MDC IDC LEAD IMPLANT DT: 20100701
MDC IDC LEAD LOCATION: 753859
MDC IDC MSMT BATTERY IMPEDANCE: 1807 Ohm
MDC IDC MSMT LEADCHNL RA PACING THRESHOLD AMPLITUDE: 0.5 V
MDC IDC MSMT LEADCHNL RA PACING THRESHOLD PULSEWIDTH: 0.4 ms
MDC IDC MSMT LEADCHNL RV IMPEDANCE VALUE: 485 Ohm
MDC IDC MSMT LEADCHNL RV PACING THRESHOLD AMPLITUDE: 0.75 V
MDC IDC MSMT LEADCHNL RV PACING THRESHOLD PULSEWIDTH: 0.4 ms
MDC IDC SESS DTM: 20190208095757
MDC IDC SET LEADCHNL RA PACING AMPLITUDE: 2 V
MDC IDC SET LEADCHNL RV SENSING SENSITIVITY: 4 mV
MDC IDC STAT BRADY AP VP PERCENT: 22 %
MDC IDC STAT BRADY AP VS PERCENT: 0 %
MDC IDC STAT BRADY AS VP PERCENT: 78 %

## 2017-12-05 LAB — BASIC METABOLIC PANEL
BUN / CREAT RATIO: 26 (ref 12–28)
BUN: 21 mg/dL (ref 8–27)
CO2: 25 mmol/L (ref 20–29)
Calcium: 9.6 mg/dL (ref 8.7–10.3)
Chloride: 101 mmol/L (ref 96–106)
Creatinine, Ser: 0.82 mg/dL (ref 0.57–1.00)
GFR calc Af Amer: 86 mL/min/{1.73_m2} (ref >59)
GFR calc non Af Amer: 74 mL/min/{1.73_m2} (ref >59)
GLUCOSE: 70 mg/dL (ref 65–99)
POTASSIUM: 3.8 mmol/L (ref 3.5–5.2)
SODIUM: 142 mmol/L (ref 134–144)

## 2017-12-05 MED ORDER — RAMIPRIL 5 MG PO CAPS: ORAL_CAPSULE | ORAL | 3 refills | Status: DC

## 2017-12-05 MED ORDER — CARVEDILOL 12.5 MG PO TABS: 12.5000 mg | ORAL_TABLET | Freq: Two times a day (BID) | ORAL | 3 refills | Status: DC

## 2017-12-05 NOTE — Patient Instructions (Addendum)
Medication Instructions:  Your physician recommends that you continue on your current medications as directed. Please refer to the Current Medication list given to you today.  Labwork: Your physician recommends that have a BMP drawn today  Testing/Procedures: None ordered.  Follow-Up: Your physician recommends that you schedule a follow-up appointment in: One year with Chanetta Marshall, PA  Remote monitoring is used to monitor your Pacemaker from home. This monitoring reduces the number of office visits required to check your device to one time per year. It allows [place] to keep an eye on the functioning of your device to ensure it is working properly. You are scheduled for a device check from home on 02/26/2018. You may send your transmission at any time that day. If you have a wireless device, the transmission will be sent automatically. After your physician reviews your transmission, you will receive a postcard with your next transmission date.   Any Other Special Instructions Will Be Listed Below (If Applicable).     If you need a refill on your cardiac medications before your next appointment, please call your pharmacy.

## 2017-12-05 NOTE — Progress Notes (Signed)
.  sklf      Patient Care Team: System, Pcp Not In as PCP - General   HPI  Melissa [name] is a 67 y.o. female is seen in followup for a pacemaker implanted  for complete heart block in setting of mild nonischemic cardiomyopathy, ejection fraction 35-40%. Cardiac catheterization demonstrated nonobstructive coronary disease.  DATE TEST    1/13 Echo    EF 30-35 %   2/18 Echo  EF 50-55%    Date Cr K  2/18 0.71 4.3        The patient denies chest pain, shortness of breath, nocturnal dyspnea, orthopnea or peripheral edema.  There have been no palpitations, lightheadedness or syncope.     Exercising and eating well  Some stress at work     Past Medical History:  Diagnosis Date  . Anxiety   . 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 DUB (dysfunctional uterine bleeding)   . Fibroid   . Hypertension   . Nonischemic cardiomyopathy ([place])    EF 35% 2013  . Pacemaker  Medtronic    medtronic- placed 2010  . Syncope    heart rate < normal, passed out     Past Surgical History:  Procedure Laterality Date  . COLONOSCOPY  2007   Normal   . INSERT / REPLACE / REMOVE PACEMAKER    . ORIF ELBOW FRACTURE  03/17/2012   Procedure: OPEN REDUCTION INTERNAL FIXATION (ORIF) ELBOW/OLECRANON FRACTURE;  Surgeon: Roseanne Kaufman, MD;  Location: [place];  Service: Orthopedics;  Laterality: Right;  RIGHT ELBOW ORIF WITH LIGAMENT REPAIR, RECONSTRUCTION  . PACEMAKER INSERTION  2010  . VAGINAL HYSTERECTOMY  07-06-01    Current Outpatient Medications  Medication Sig Dispense Refill  . calcium carbonate (OS-CAL) 600 MG TABS Take 600 mg by mouth 2 (two) times daily with a meal.     . carvedilol (COREG) 12.5 MG tablet Take 1 tablet (12.5 mg total) by mouth 2 (two) times daily with a meal. 180 tablet 3  . Multiple Vitamins-Minerals ([employer]S SPECTRAVITE PO) Take 1 tablet by mouth daily.      . ramipril (ALTACE) 5 MG capsule TAKE 1 CAPSULE (5 MG TOTAL) BY MOUTH DAILY. 90 capsule 3   No current  facility-administered medications for this visit.     Allergies  Allergen Reactions  . Prednisone     REACTION: GI distress    Review of Systems negative except from HPI and PMH  Physical Exam BP 124/80   Pulse 68   Ht 5\' 3"  (1.6 m)   Wt 112 lb (50.8 kg)   BMI 19.84 kg/m  Well developed and nourished in no acute distress HENT normal Neck supple with JVP-flat Clear Device pocket well healed; without hematoma or erythema.  There is no tethering  Regular rate and rhythm, no murmurs or gallops Abd-soft with active BS No Clubbing cyanosis edema Skin-warm and dry A & Oriented  Grossly normal sensory and motor function   ECG demonstrates sinus P-synchronous/ AV  pacing Rare PVC 13/08/39   Assessment and  Plan  Nonischemic cardiomyopathy   complete heart block  CHF class 2   Interval normalization of LV function  Tolerating meds without problems  Will check surveillance labs on altace  Euvolemic continue current meds

## 2017-12-12 LAB — CUP PACEART REMOTE DEVICE CHECK
Battery Voltage: 2.74 V
Brady Statistic AP VP Percent: 22 %
Brady Statistic AP VS Percent: 0 %
Brady Statistic AS VP Percent: 78 %
Implantable Lead Implant Date: 20100701
Implantable Lead Location: 753859
Implantable Lead Location: 753860
Implantable Lead Model: 5076
Implantable Lead Model: 5076
Implantable Pulse Generator Implant Date: 20100701
Lead Channel Impedance Value: 493 Ohm
Lead Channel Impedance Value: 630 Ohm
Lead Channel Pacing Threshold Amplitude: 0.5 V
Lead Channel Pacing Threshold Pulse Width: 0.4 ms
Lead Channel Pacing Threshold Pulse Width: 0.4 ms
Lead Channel Setting Pacing Pulse Width: 0.4 ms
MDC IDC LEAD IMPLANT DT: 20100701
MDC IDC MSMT BATTERY IMPEDANCE: 1894 Ohm
MDC IDC MSMT BATTERY REMAINING LONGEVITY: 35 mo
MDC IDC MSMT LEADCHNL RV PACING THRESHOLD AMPLITUDE: 0.75 V
MDC IDC SESS DTM: 20190131104937
MDC IDC SET LEADCHNL RA PACING AMPLITUDE: 2 V
MDC IDC SET LEADCHNL RV PACING AMPLITUDE: 2.5 V
MDC IDC SET LEADCHNL RV SENSING SENSITIVITY: 4 mV
MDC IDC STAT BRADY AS VS PERCENT: 0 %

## 2017-12-15 ENCOUNTER — Encounter: Payer: Self-pay | Admitting: Internal Medicine

## 2018-02-26 ENCOUNTER — Ambulatory Visit (INDEPENDENT_AMBULATORY_CARE_PROVIDER_SITE_OTHER): Payer: BC Managed Care – PPO | Admitting: *Deleted

## 2018-02-26 DIAGNOSIS — I442 Atrioventricular block, complete: Secondary | ICD-10-CM

## 2018-02-26 NOTE — Progress Notes (Signed)
Remote pacemaker transmission.   

## 2018-03-03 ENCOUNTER — Encounter: Payer: Self-pay | Admitting: Cardiology

## 2018-03-10 LAB — CUP PACEART REMOTE DEVICE CHECK
Battery Remaining Longevity: 36 mo
Brady Statistic AP VS Percent: 0 %
Brady Statistic AS VS Percent: 0 %
Implantable Lead Implant Date: 20100701
Implantable Lead Model: 5076
Lead Channel Impedance Value: 470 Ohm
Lead Channel Pacing Threshold Amplitude: 0.875 V
Lead Channel Pacing Threshold Pulse Width: 0.4 ms
Lead Channel Setting Pacing Amplitude: 2 V
Lead Channel Setting Pacing Amplitude: 2.5 V
Lead Channel Setting Sensing Sensitivity: 4 mV
MDC IDC LEAD IMPLANT DT: 20100701
MDC IDC LEAD LOCATION: 753859
MDC IDC LEAD LOCATION: 753860
MDC IDC MSMT BATTERY IMPEDANCE: 1838 Ohm
MDC IDC MSMT BATTERY VOLTAGE: 2.75 V
MDC IDC MSMT LEADCHNL RA IMPEDANCE VALUE: 601 Ohm
MDC IDC MSMT LEADCHNL RA PACING THRESHOLD AMPLITUDE: 0.5 V
MDC IDC MSMT LEADCHNL RA PACING THRESHOLD PULSEWIDTH: 0.4 ms
MDC IDC PG IMPLANT DT: 20100701
MDC IDC SESS DTM: 20190502083748
MDC IDC SET LEADCHNL RV PACING PULSEWIDTH: 0.4 ms
MDC IDC STAT BRADY AP VP PERCENT: 25 %
MDC IDC STAT BRADY AS VP PERCENT: 75 %

## 2018-05-13 ENCOUNTER — Other Ambulatory Visit: Payer: Self-pay | Admitting: Family Medicine

## 2018-05-13 DIAGNOSIS — E2839 Other primary ovarian failure: Secondary | ICD-10-CM

## 2018-05-28 ENCOUNTER — Ambulatory Visit (INDEPENDENT_AMBULATORY_CARE_PROVIDER_SITE_OTHER): Payer: BC Managed Care – PPO | Admitting: *Deleted

## 2018-05-28 ENCOUNTER — Encounter: Payer: Self-pay | Admitting: Cardiology

## 2018-05-28 DIAGNOSIS — I442 Atrioventricular block, complete: Secondary | ICD-10-CM | POA: Diagnosis not present

## 2018-05-28 NOTE — Progress Notes (Signed)
Remote pacemaker transmission.   

## 2018-06-25 LAB — CUP PACEART REMOTE DEVICE CHECK
Battery Impedance: 1954 Ohm
Brady Statistic AP VP Percent: 26 %
Brady Statistic AP VS Percent: 0 %
Brady Statistic AS VP Percent: 74 %
Brady Statistic AS VS Percent: 0 %
Implantable Lead Implant Date: 20100701
Implantable Lead Location: 753859
Implantable Lead Model: 5076
Lead Channel Impedance Value: 496 Ohm
Lead Channel Impedance Value: 607 Ohm
Lead Channel Pacing Threshold Amplitude: 0.375 V
Lead Channel Pacing Threshold Pulse Width: 0.4 ms
Lead Channel Sensing Intrinsic Amplitude: 0.7 mV
Lead Channel Setting Pacing Amplitude: 2 V
MDC IDC LEAD IMPLANT DT: 20100701
MDC IDC LEAD LOCATION: 753860
MDC IDC MSMT BATTERY REMAINING LONGEVITY: 34 mo
MDC IDC MSMT BATTERY VOLTAGE: 2.74 V
MDC IDC MSMT LEADCHNL RV PACING THRESHOLD AMPLITUDE: 1.25 V
MDC IDC MSMT LEADCHNL RV PACING THRESHOLD PULSEWIDTH: 0.4 ms
MDC IDC PG IMPLANT DT: 20100701
MDC IDC SESS DTM: 20190801100640
MDC IDC SET LEADCHNL RV PACING AMPLITUDE: 2.5 V
MDC IDC SET LEADCHNL RV PACING PULSEWIDTH: 0.4 ms
MDC IDC SET LEADCHNL RV SENSING SENSITIVITY: 4 mV

## 2018-08-27 ENCOUNTER — Ambulatory Visit (INDEPENDENT_AMBULATORY_CARE_PROVIDER_SITE_OTHER): Payer: BC Managed Care – PPO | Admitting: *Deleted

## 2018-08-27 DIAGNOSIS — I428 Other cardiomyopathies: Secondary | ICD-10-CM

## 2018-08-27 DIAGNOSIS — I442 Atrioventricular block, complete: Secondary | ICD-10-CM | POA: Diagnosis not present

## 2018-08-27 NOTE — Progress Notes (Signed)
Remote pacemaker transmission.   

## 2018-09-06 ENCOUNTER — Encounter: Payer: Self-pay | Admitting: Cardiology

## 2018-10-27 LAB — CUP PACEART REMOTE DEVICE CHECK
Battery Impedance: 2134 Ohm
Battery Remaining Longevity: 27 mo
Battery Voltage: 2.73 V
Brady Statistic AP VP Percent: 27 %
Brady Statistic AP VS Percent: 0 %
Brady Statistic AS VS Percent: 0 %
Implantable Lead Implant Date: 20100701
Implantable Lead Model: 5076
Implantable Pulse Generator Implant Date: 20100701
Lead Channel Impedance Value: 486 Ohm
Lead Channel Impedance Value: 607 Ohm
Lead Channel Pacing Threshold Amplitude: 1.375 V
Lead Channel Pacing Threshold Pulse Width: 0.4 ms
Lead Channel Setting Pacing Amplitude: 2 V
Lead Channel Setting Pacing Amplitude: 2.75 V
Lead Channel Setting Sensing Sensitivity: 4 mV
MDC IDC LEAD IMPLANT DT: 20100701
MDC IDC LEAD LOCATION: 753859
MDC IDC LEAD LOCATION: 753860
MDC IDC MSMT LEADCHNL RA PACING THRESHOLD AMPLITUDE: 0.5 V
MDC IDC MSMT LEADCHNL RV PACING THRESHOLD PULSEWIDTH: 0.4 ms
MDC IDC SESS DTM: 20191031101645
MDC IDC SET LEADCHNL RV PACING PULSEWIDTH: 0.46 ms
MDC IDC STAT BRADY AS VP PERCENT: 73 %

## 2018-11-26 ENCOUNTER — Ambulatory Visit (INDEPENDENT_AMBULATORY_CARE_PROVIDER_SITE_OTHER): Payer: BC Managed Care – PPO

## 2018-11-26 DIAGNOSIS — I442 Atrioventricular block, complete: Secondary | ICD-10-CM

## 2018-11-27 LAB — CUP PACEART REMOTE DEVICE CHECK
Battery Remaining Longevity: 32 mo
Battery Voltage: 2.74 V
Brady Statistic AP VP Percent: 27 %
Brady Statistic AP VS Percent: 0 %
Brady Statistic AS VP Percent: 73 %
Date Time Interrogation Session: 20200130094436
Implantable Lead Implant Date: 20100701
Implantable Lead Location: 753859
Implantable Lead Location: 753860
Implantable Lead Model: 5076
Implantable Lead Model: 5076
Lead Channel Impedance Value: 606 Ohm
Lead Channel Pacing Threshold Amplitude: 0.5 V
Lead Channel Pacing Threshold Amplitude: 1 V
Lead Channel Pacing Threshold Pulse Width: 0.4 ms
Lead Channel Pacing Threshold Pulse Width: 0.4 ms
Lead Channel Setting Pacing Amplitude: 2 V
MDC IDC LEAD IMPLANT DT: 20100701
MDC IDC MSMT BATTERY IMPEDANCE: 2077 Ohm
MDC IDC MSMT LEADCHNL RV IMPEDANCE VALUE: 496 Ohm
MDC IDC PG IMPLANT DT: 20100701
MDC IDC SET LEADCHNL RV PACING AMPLITUDE: 2.5 V
MDC IDC SET LEADCHNL RV PACING PULSEWIDTH: 0.4 ms
MDC IDC SET LEADCHNL RV SENSING SENSITIVITY: 4 mV
MDC IDC STAT BRADY AS VS PERCENT: 0 %

## 2018-12-04 NOTE — Progress Notes (Signed)
Remote pacemaker transmission.   

## 2018-12-07 ENCOUNTER — Encounter: Payer: Self-pay | Admitting: Cardiology

## 2018-12-14 ENCOUNTER — Ambulatory Visit: Payer: BC Managed Care – PPO | Admitting: Internal Medicine

## 2018-12-14 ENCOUNTER — Encounter: Payer: Self-pay | Admitting: Internal Medicine

## 2018-12-14 VITALS — BP 118/74 | HR 60 | Ht 63.0 in | Wt 111.2 lb

## 2018-12-14 DIAGNOSIS — I428 Other cardiomyopathies: Secondary | ICD-10-CM

## 2018-12-14 DIAGNOSIS — I442 Atrioventricular block, complete: Secondary | ICD-10-CM

## 2018-12-14 DIAGNOSIS — Z950 Presence of cardiac pacemaker: Secondary | ICD-10-CM | POA: Diagnosis not present

## 2018-12-14 MED ORDER — RAMIPRIL 5 MG PO CAPS: ORAL_CAPSULE | ORAL | 3 refills | Status: DC

## 2018-12-14 MED ORDER — CARVEDILOL 12.5 MG PO TABS: 12.5000 mg | ORAL_TABLET | Freq: Two times a day (BID) | ORAL | 3 refills | Status: DC

## 2018-12-14 NOTE — Patient Instructions (Signed)

## 2018-12-14 NOTE — Progress Notes (Signed)
.  sklf      Patient Care Team: Bernerd Limbo, MD as PCP - General (Family Medicine)   HPI  Melissa [name] is a 68 y.o. female is seen in followup for a pacemaker implanted  for complete heart block in setting of mild nonischemic cardiomyopathy, ejection fraction 35-40%. Cardiac catheterization demonstrated nonobstructive coronary disease.  DATE TEST    1/13 Echo    EF 30-35 %   2/18 Echo  EF 50-55%    Date Cr K  2/18 0.71 4.3  2/18  0.82 3.6   The patient denies chest pain, shortness of breath, nocturnal dyspnea, orthopnea or peripheral edema.  There have been no palpitations, lightheadedness or syncope.     Exercising and eating well  Some stress at work     Past Medical History:  Diagnosis Date  . Anxiety   . 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 DUB (dysfunctional uterine bleeding)   . Fibroid   . Hypertension   . Nonischemic cardiomyopathy ([place])    EF 35% 2013  . Pacemaker  Medtronic    medtronic- placed 2010  . Syncope    heart rate < normal, passed out     Past Surgical History:  Procedure Laterality Date  . COLONOSCOPY  2007   Normal   . INSERT / REPLACE / REMOVE PACEMAKER    . ORIF ELBOW FRACTURE  03/17/2012   Procedure: OPEN REDUCTION INTERNAL FIXATION (ORIF) ELBOW/OLECRANON FRACTURE;  Surgeon: Roseanne Kaufman, MD;  Location: [place];  Service: Orthopedics;  Laterality: Right;  RIGHT ELBOW ORIF WITH LIGAMENT REPAIR, RECONSTRUCTION  . PACEMAKER INSERTION  2010  . VAGINAL HYSTERECTOMY  07-06-01    Current Outpatient Medications  Medication Sig Dispense Refill  . calcium carbonate (OS-CAL) 600 MG TABS Take 600 mg by mouth 2 (two) times daily with a meal.     . carvedilol (COREG) 12.5 MG tablet Take 1 tablet (12.5 mg total) by mouth 2 (two) times daily with a meal. 180 tablet 3  . Multiple Vitamins-Minerals ([employer]S SPECTRAVITE PO) Take 1 tablet by mouth daily.      . ramipril (ALTACE) 5 MG capsule TAKE 1 CAPSULE (5 MG TOTAL) BY MOUTH DAILY. 90 capsule  3   No current facility-administered medications for this visit.     Allergies  Allergen Reactions  . Prednisone     REACTION: GI distress    Review of Systems negative except from HPI and PMH  Physical Exam BP 118/74   Pulse 60   Ht 5\' 3"  (1.6 m)   Wt 111 lb 3.2 oz (50.4 kg)   SpO2 97%   BMI 19.70 kg/m  Well developed and well nourished in no acute distress HENT normal Neck supple with JVP-flat Clear Device pocket well healed; without hematoma or erythema.  There is no tethering  Regular rate and rhythm, no gallop No murmur Abd-soft with active BS No Clubbing cyanosis  edema Skin-warm and dry A & Oriented  Grossly normal sensory and motor function   ECG  AV pacing at 60 17/15/47   Assessment and  Plan  Nonischemic cardiomyopathy   Complete heart block  Pacemaker-dual-chamber-Medtronic The patient's device was interrogated.  The information was reviewed. No changes were made in the programming.     CHF class 2   Euvolemic continue current meds  Tolerating medications.  We will continue beta-blockers and ACE inhibitors indefinitely

## 2018-12-18 LAB — CUP PACEART INCLINIC DEVICE CHECK
Battery Impedance: 2074 Ohm
Battery Remaining Longevity: 32 mo
Battery Voltage: 2.75 V
Brady Statistic AP VP Percent: 27 %
Brady Statistic AP VS Percent: 0 %
Brady Statistic AS VP Percent: 73 %
Brady Statistic AS VS Percent: 0 %
Date Time Interrogation Session: 20200217214052
Implantable Lead Implant Date: 20100701
Implantable Lead Location: 753859
Implantable Lead Location: 753860
Implantable Lead Model: 5076
Implantable Lead Model: 5076
Implantable Pulse Generator Implant Date: 20100701
Lead Channel Impedance Value: 501 Ohm
Lead Channel Impedance Value: 606 Ohm
Lead Channel Pacing Threshold Amplitude: 0.5 V
Lead Channel Pacing Threshold Amplitude: 0.5 V
Lead Channel Pacing Threshold Amplitude: 0.5 V
Lead Channel Pacing Threshold Amplitude: 1.25 V
Lead Channel Pacing Threshold Pulse Width: 0.4 ms
Lead Channel Pacing Threshold Pulse Width: 0.4 ms
Lead Channel Pacing Threshold Pulse Width: 0.4 ms
Lead Channel Pacing Threshold Pulse Width: 0.4 ms
Lead Channel Sensing Intrinsic Amplitude: 0.7 mV
Lead Channel Setting Pacing Amplitude: 2 V
Lead Channel Setting Pacing Pulse Width: 0.4 ms
Lead Channel Setting Sensing Sensitivity: 4 mV
MDC IDC LEAD IMPLANT DT: 20100701
MDC IDC SET LEADCHNL RV PACING AMPLITUDE: 2.5 V

## 2019-02-25 ENCOUNTER — Other Ambulatory Visit: Payer: Self-pay

## 2019-02-25 ENCOUNTER — Ambulatory Visit (INDEPENDENT_AMBULATORY_CARE_PROVIDER_SITE_OTHER): Payer: BC Managed Care – PPO | Admitting: *Deleted

## 2019-02-25 DIAGNOSIS — I442 Atrioventricular block, complete: Secondary | ICD-10-CM | POA: Diagnosis not present

## 2019-02-25 LAB — CUP PACEART REMOTE DEVICE CHECK
Battery Impedance: 2185 Ohm
Battery Remaining Longevity: 30 mo
Battery Voltage: 2.74 V
Brady Statistic AP VP Percent: 28 %
Brady Statistic AP VS Percent: 0 %
Brady Statistic AS VP Percent: 72 %
Brady Statistic AS VS Percent: 0 %
Date Time Interrogation Session: 20200430112340
Implantable Lead Implant Date: 20100701
Implantable Lead Implant Date: 20100701
Implantable Lead Location: 753859
Implantable Lead Location: 753860
Implantable Lead Model: 5076
Implantable Lead Model: 5076
Implantable Pulse Generator Implant Date: 20100701
Lead Channel Impedance Value: 523 Ohm
Lead Channel Impedance Value: 634 Ohm
Lead Channel Pacing Threshold Amplitude: 0.5 V
Lead Channel Pacing Threshold Amplitude: 0.875 V
Lead Channel Pacing Threshold Pulse Width: 0.4 ms
Lead Channel Pacing Threshold Pulse Width: 0.4 ms
Lead Channel Setting Pacing Amplitude: 2 V
Lead Channel Setting Pacing Amplitude: 2.5 V
Lead Channel Setting Pacing Pulse Width: 0.46 ms
Lead Channel Setting Sensing Sensitivity: 4 mV

## 2019-03-05 ENCOUNTER — Encounter: Payer: Self-pay | Admitting: Cardiology

## 2019-03-05 NOTE — Progress Notes (Signed)
Remote pacemaker transmission.   

## 2019-05-27 ENCOUNTER — Ambulatory Visit (INDEPENDENT_AMBULATORY_CARE_PROVIDER_SITE_OTHER): Payer: BC Managed Care – PPO | Admitting: *Deleted

## 2019-05-27 DIAGNOSIS — I442 Atrioventricular block, complete: Secondary | ICD-10-CM

## 2019-05-28 LAB — CUP PACEART REMOTE DEVICE CHECK
Battery Impedance: 2218 Ohm
Battery Remaining Longevity: 30 mo
Battery Voltage: 2.73 V
Brady Statistic AP VP Percent: 28 %
Brady Statistic AP VS Percent: 0 %
Brady Statistic AS VP Percent: 72 %
Brady Statistic AS VS Percent: 0 %
Date Time Interrogation Session: 20200730100030
Implantable Lead Implant Date: 20100701
Implantable Lead Implant Date: 20100701
Implantable Lead Location: 753859
Implantable Lead Location: 753860
Implantable Lead Model: 5076
Implantable Lead Model: 5076
Implantable Pulse Generator Implant Date: 20100701
Lead Channel Impedance Value: 502 Ohm
Lead Channel Impedance Value: 612 Ohm
Lead Channel Pacing Threshold Amplitude: 0.5 V
Lead Channel Pacing Threshold Amplitude: 1 V
Lead Channel Pacing Threshold Pulse Width: 0.4 ms
Lead Channel Pacing Threshold Pulse Width: 0.4 ms
Lead Channel Setting Pacing Amplitude: 2 V
Lead Channel Setting Pacing Amplitude: 2.5 V
Lead Channel Setting Pacing Pulse Width: 0.4 ms
Lead Channel Setting Sensing Sensitivity: 4 mV

## 2019-06-04 ENCOUNTER — Encounter: Payer: Self-pay | Admitting: Cardiology

## 2019-06-04 NOTE — Progress Notes (Signed)
Remote pacemaker transmission.   

## 2019-07-12 DIAGNOSIS — M4124 Other idiopathic scoliosis, thoracic region: Secondary | ICD-10-CM

## 2019-07-15 ENCOUNTER — Other Ambulatory Visit: Payer: Self-pay | Admitting: Physician Assistant

## 2019-07-15 DIAGNOSIS — E2839 Other primary ovarian failure: Secondary | ICD-10-CM

## 2019-08-26 ENCOUNTER — Ambulatory Visit (INDEPENDENT_AMBULATORY_CARE_PROVIDER_SITE_OTHER): Payer: BC Managed Care – PPO | Admitting: *Deleted

## 2019-08-26 DIAGNOSIS — I442 Atrioventricular block, complete: Secondary | ICD-10-CM | POA: Diagnosis not present

## 2019-08-26 DIAGNOSIS — I428 Other cardiomyopathies: Secondary | ICD-10-CM

## 2019-08-26 LAB — CUP PACEART REMOTE DEVICE CHECK
Battery Impedance: 2233 Ohm
Battery Remaining Longevity: 30 mo
Battery Voltage: 2.74 V
Brady Statistic AP VP Percent: 28 %
Brady Statistic AP VS Percent: 0 %
Brady Statistic AS VP Percent: 72 %
Brady Statistic AS VS Percent: 0 %
Date Time Interrogation Session: 20201029114539
Implantable Lead Implant Date: 20100701
Implantable Lead Implant Date: 20100701
Implantable Lead Location: 753859
Implantable Lead Location: 753860
Implantable Lead Model: 5076
Implantable Lead Model: 5076
Implantable Pulse Generator Implant Date: 20100701
Lead Channel Impedance Value: 498 Ohm
Lead Channel Impedance Value: 634 Ohm
Lead Channel Pacing Threshold Amplitude: 0.375 V
Lead Channel Pacing Threshold Amplitude: 1.125 V
Lead Channel Pacing Threshold Pulse Width: 0.4 ms
Lead Channel Pacing Threshold Pulse Width: 0.4 ms
Lead Channel Sensing Intrinsic Amplitude: 0.7 mV
Lead Channel Setting Pacing Amplitude: 2 V
Lead Channel Setting Pacing Amplitude: 2.5 V
Lead Channel Setting Pacing Pulse Width: 0.4 ms
Lead Channel Setting Sensing Sensitivity: 4 mV

## 2019-09-18 NOTE — Progress Notes (Signed)
Remote pacemaker transmission.   

## 2019-10-27 ENCOUNTER — Other Ambulatory Visit: Payer: BC Managed Care – PPO

## 2019-11-09 ENCOUNTER — Ambulatory Visit: Payer: BC Managed Care – PPO | Admitting: Internal Medicine

## 2019-11-09 ENCOUNTER — Other Ambulatory Visit: Payer: Self-pay

## 2019-11-09 VITALS — BP 126/72 | HR 68 | Ht 63.0 in | Wt 108.0 lb

## 2019-11-09 DIAGNOSIS — I428 Other cardiomyopathies: Secondary | ICD-10-CM | POA: Diagnosis not present

## 2019-11-09 DIAGNOSIS — I442 Atrioventricular block, complete: Secondary | ICD-10-CM | POA: Diagnosis not present

## 2019-11-09 DIAGNOSIS — Z950 Presence of cardiac pacemaker: Secondary | ICD-10-CM

## 2019-11-09 MED ORDER — RAMIPRIL 5 MG PO CAPS: ORAL_CAPSULE | ORAL | 3 refills | Status: DC

## 2019-11-09 MED ORDER — CARVEDILOL 12.5 MG PO TABS: 12.5000 mg | ORAL_TABLET | Freq: Two times a day (BID) | ORAL | 3 refills | Status: DC

## 2019-11-09 NOTE — Progress Notes (Signed)
.  sklf      Patient Care Team: Bernerd Limbo, MD as PCP - General (Family Medicine)   HPI  Melissa [name] is a 69 y.o. female is seen in followup for a pacemaker implanted  for complete heart block in setting of mild nonischemic cardiomyopathy, ejection fraction 35-40%. Cardiac catheterization demonstrated nonobstructive coronary disease.  DATE TEST    1/13 Echo    EF 30-35 %   2/18 Echo  EF 50-55%    Date Cr K  2/18 0.71 4.3  2/18  0.82 3.6   The patient denies chest pain, shortness of breath, nocturnal dyspnea, orthopnea or peripheral edema.  There have been no palpitations, lightheadedness or syncope.      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 Anxiety   . 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 DUB (dysfunctional uterine bleeding)   . Fibroid   . Hypertension   . Nonischemic cardiomyopathy ([place])    EF 35% 2013  . Pacemaker  Medtronic    medtronic- placed 2010  . Syncope    heart rate < normal, passed out     Past Surgical History:  Procedure Laterality Date  . COLONOSCOPY  2007   Normal   . INSERT / REPLACE / REMOVE PACEMAKER    . ORIF ELBOW FRACTURE  03/17/2012   Procedure: OPEN REDUCTION INTERNAL FIXATION (ORIF) ELBOW/OLECRANON FRACTURE;  Surgeon: Roseanne Kaufman, MD;  Location: [place];  Service: Orthopedics;  Laterality: Right;  RIGHT ELBOW ORIF WITH LIGAMENT REPAIR, RECONSTRUCTION  . PACEMAKER INSERTION  2010  . VAGINAL HYSTERECTOMY  07-06-01    Current Outpatient Medications  Medication Sig Dispense Refill  . calcium carbonate (OS-CAL) 600 MG TABS Take 600 mg by mouth 2 (two) times daily with a meal.     . carvedilol (COREG) 12.5 MG tablet Take 1 tablet (12.5 mg total) by mouth 2 (two) times daily with a meal. 180 tablet 3  . Multiple Vitamins-Minerals ([employer]S SPECTRAVITE PO) Take 1 tablet by mouth daily.      . ramipril (ALTACE) 5 MG capsule TAKE 1 CAPSULE (5 MG TOTAL) BY MOUTH DAILY.  90 capsule 3   No current facility-administered medications for this visit.    Allergies  Allergen Reactions  . Prednisone     REACTION: GI distress    Review of Systems negative except from HPI and PMH  Physical Exam BP 126/72   Pulse 68   Ht 5\' 3"  (1.6 m)   Wt 108 lb (49 kg)   BMI 19.13 kg/m  Well developed and well nourished in no acute distress HENT normal Neck supple   Clear Device pocket well healed; without hematoma or erythema.  There is no tethering  Regular rate and rhythm, no   murmur Abd-soft with active BS No Clubbing cyanosis   edema Skin-warm and dry A & Oriented  Grossly normal sensory and motor function  ECG sinus w P-synchronous/ AV  pacing      Assessment and  Plan  Nonischemic cardiomyopathy   Complete heart block  Pacemaker-dual-chamber-Medtronic  The patient's device was interrogated.  The information was reviewed. No changes were made in the programming.     CHF class 2  Euvolemic continue current meds

## 2019-11-09 NOTE — Patient Instructions (Signed)
Medication Instructions:  Your physician recommends that you continue on your current medications as directed. Please refer to the Current Medication list given to you today.  Labwork: None ordered.  Testing/Procedures: None ordered.  Follow-Up: Your physician wants you to follow-up in: one year You will receive a reminder letter in the mail two months in advance. If you don't receive a letter, please call our office to schedule the follow-up appointment.  Remote monitoring is used to monitor your Pacemaker of ICD from home. This monitoring reduces the number of office visits required to check your device to one time per year. It allows [place] to keep an eye on the functioning of your device to ensure it is working properly. Any Other Special Instructions Will Be Listed Below (If Applicable).  If you need a refill on your cardiac medications before your next appointment, please call your pharmacy.

## 2019-11-11 LAB — CUP PACEART INCLINIC DEVICE CHECK
Date Time Interrogation Session: 20210112114435
Implantable Lead Implant Date: 20100701
Implantable Lead Implant Date: 20100701
Implantable Lead Location: 753859
Implantable Lead Location: 753860
Implantable Lead Model: 5076
Implantable Lead Model: 5076
Implantable Pulse Generator Implant Date: 20100701

## 2019-11-25 ENCOUNTER — Ambulatory Visit (INDEPENDENT_AMBULATORY_CARE_PROVIDER_SITE_OTHER): Payer: BC Managed Care – PPO | Admitting: *Deleted

## 2019-11-25 DIAGNOSIS — I442 Atrioventricular block, complete: Secondary | ICD-10-CM | POA: Diagnosis not present

## 2019-11-26 LAB — CUP PACEART REMOTE DEVICE CHECK
Battery Impedance: 2216 Ohm
Battery Remaining Longevity: 30 mo
Battery Voltage: 2.73 V
Brady Statistic AP VP Percent: 23 %
Brady Statistic AP VS Percent: 0 %
Brady Statistic AS VP Percent: 77 %
Brady Statistic AS VS Percent: 0 %
Date Time Interrogation Session: 20210129152739
Implantable Lead Implant Date: 20100701
Implantable Lead Implant Date: 20100701
Implantable Lead Location: 753859
Implantable Lead Location: 753860
Implantable Lead Model: 5076
Implantable Lead Model: 5076
Implantable Pulse Generator Implant Date: 20100701
Lead Channel Impedance Value: 487 Ohm
Lead Channel Impedance Value: 623 Ohm
Lead Channel Pacing Threshold Amplitude: 0.375 V
Lead Channel Pacing Threshold Amplitude: 0.75 V
Lead Channel Pacing Threshold Pulse Width: 0.4 ms
Lead Channel Pacing Threshold Pulse Width: 0.4 ms
Lead Channel Setting Pacing Amplitude: 2 V
Lead Channel Setting Pacing Amplitude: 2.5 V
Lead Channel Setting Pacing Pulse Width: 0.4 ms
Lead Channel Setting Sensing Sensitivity: 4 mV

## 2019-12-27 ENCOUNTER — Encounter: Payer: BC Managed Care – PPO | Admitting: Internal Medicine

## 2020-02-24 ENCOUNTER — Ambulatory Visit (INDEPENDENT_AMBULATORY_CARE_PROVIDER_SITE_OTHER): Payer: BC Managed Care – PPO | Admitting: *Deleted

## 2020-02-24 DIAGNOSIS — I442 Atrioventricular block, complete: Secondary | ICD-10-CM | POA: Diagnosis not present

## 2020-02-25 LAB — CUP PACEART REMOTE DEVICE CHECK
Battery Impedance: 2310 Ohm
Battery Remaining Longevity: 29 mo
Battery Voltage: 2.73 V
Brady Statistic AP VP Percent: 22 %
Brady Statistic AP VS Percent: 0 %
Brady Statistic AS VP Percent: 78 %
Brady Statistic AS VS Percent: 0 %
Date Time Interrogation Session: 20210429183857
Implantable Lead Implant Date: 20100701
Implantable Lead Implant Date: 20100701
Implantable Lead Location: 753859
Implantable Lead Location: 753860
Implantable Lead Model: 5076
Implantable Lead Model: 5076
Implantable Pulse Generator Implant Date: 20100701
Lead Channel Impedance Value: 494 Ohm
Lead Channel Impedance Value: 622 Ohm
Lead Channel Pacing Threshold Amplitude: 0.5 V
Lead Channel Pacing Threshold Amplitude: 0.875 V
Lead Channel Pacing Threshold Pulse Width: 0.4 ms
Lead Channel Pacing Threshold Pulse Width: 0.4 ms
Lead Channel Setting Pacing Amplitude: 2 V
Lead Channel Setting Pacing Amplitude: 2.5 V
Lead Channel Setting Pacing Pulse Width: 0.4 ms
Lead Channel Setting Sensing Sensitivity: 4 mV

## 2020-02-25 NOTE — Progress Notes (Signed)
PPM Remote  

## 2020-05-25 ENCOUNTER — Ambulatory Visit (INDEPENDENT_AMBULATORY_CARE_PROVIDER_SITE_OTHER): Payer: BC Managed Care – PPO | Admitting: *Deleted

## 2020-05-25 DIAGNOSIS — I442 Atrioventricular block, complete: Secondary | ICD-10-CM | POA: Diagnosis not present

## 2020-05-31 LAB — CUP PACEART REMOTE DEVICE CHECK
Battery Impedance: 2438 Ohm
Battery Remaining Longevity: 27 mo
Battery Voltage: 2.73 V
Brady Statistic AP VP Percent: 21 %
Brady Statistic AP VS Percent: 0 %
Brady Statistic AS VP Percent: 78 %
Brady Statistic AS VS Percent: 0 %
Date Time Interrogation Session: 20210803180725
Implantable Lead Implant Date: 20100701
Implantable Lead Implant Date: 20100701
Implantable Lead Location: 753859
Implantable Lead Location: 753860
Implantable Lead Model: 5076
Implantable Lead Model: 5076
Implantable Pulse Generator Implant Date: 20100701
Lead Channel Impedance Value: 491 Ohm
Lead Channel Impedance Value: 600 Ohm
Lead Channel Pacing Threshold Amplitude: 0.375 V
Lead Channel Pacing Threshold Amplitude: 0.875 V
Lead Channel Pacing Threshold Pulse Width: 0.4 ms
Lead Channel Pacing Threshold Pulse Width: 0.4 ms
Lead Channel Setting Pacing Amplitude: 2 V
Lead Channel Setting Pacing Amplitude: 2.5 V
Lead Channel Setting Pacing Pulse Width: 0.4 ms
Lead Channel Setting Sensing Sensitivity: 4 mV

## 2020-06-02 NOTE — Progress Notes (Signed)
Remote pacemaker transmission.   

## 2020-06-10 ENCOUNTER — Other Ambulatory Visit: Payer: Self-pay

## 2020-06-10 ENCOUNTER — Emergency Department (HOSPITAL_COMMUNITY)
Admission: EM | Admit: 2020-06-10 | Discharge: 2020-06-10 | Disposition: A | Discharge status: Home / Self Care | Payer: BC Managed Care – PPO | Attending: Emergency Medicine | Admitting: Emergency Medicine

## 2020-06-10 ENCOUNTER — Emergency Department (HOSPITAL_COMMUNITY): Payer: BC Managed Care – PPO

## 2020-06-10 DIAGNOSIS — Z950 Presence of cardiac pacemaker: Secondary | ICD-10-CM | POA: Diagnosis not present

## 2020-06-10 DIAGNOSIS — R05 Cough: Secondary | ICD-10-CM | POA: Diagnosis not present

## 2020-06-10 DIAGNOSIS — N390 Urinary tract infection, site not specified: Secondary | ICD-10-CM | POA: Insufficient documentation

## 2020-06-10 DIAGNOSIS — R300 Dysuria: Secondary | ICD-10-CM | POA: Insufficient documentation

## 2020-06-10 DIAGNOSIS — R059 Cough, unspecified: Secondary | ICD-10-CM

## 2020-06-10 DIAGNOSIS — I10 Essential (primary) hypertension: Secondary | ICD-10-CM | POA: Diagnosis not present

## 2020-06-10 DIAGNOSIS — R531 Weakness: Secondary | ICD-10-CM | POA: Diagnosis present

## 2020-06-10 LAB — URINALYSIS, ROUTINE W REFLEX MICROSCOPIC
Bilirubin Urine: NEGATIVE
Glucose, UA: NEGATIVE mg/dL
Ketones, ur: 20 mg/dL — AB
Nitrite: NEGATIVE
Protein, ur: 100 mg/dL — AB
Specific Gravity, Urine: 1.014 (ref 1.005–1.030)
WBC, UA: >50 WBC/hpf — ABNORMAL HIGH (ref 0–5)
pH: 5 (ref 5.0–8.0)

## 2020-06-10 LAB — COMPREHENSIVE METABOLIC PANEL
ALT: 12 U/L (ref 0–44)
AST: 16 U/L (ref 15–41)
Albumin: 3.2 g/dL — ABNORMAL LOW (ref 3.5–5.0)
Alkaline Phosphatase: 45 U/L (ref 38–126)
Anion gap: 14 (ref 5–15)
BUN: 37 mg/dL — ABNORMAL HIGH (ref 8–23)
CO2: 24 mmol/L (ref 22–32)
Calcium: 8.2 mg/dL — ABNORMAL LOW (ref 8.9–10.3)
Chloride: 101 mmol/L (ref 98–111)
Creatinine, Ser: 0.94 mg/dL (ref 0.44–1.00)
GFR calc Af Amer: >60 mL/min (ref >60)
GFR calc non Af Amer: >60 mL/min (ref >60)
Glucose, Bld: 105 mg/dL — ABNORMAL HIGH (ref 70–99)
Potassium: 3.8 mmol/L (ref 3.5–5.1)
Sodium: 139 mmol/L (ref 135–145)
Total Bilirubin: 0.7 mg/dL (ref 0.3–1.2)
Total Protein: 6.7 g/dL (ref 6.5–8.1)

## 2020-06-10 LAB — CBC WITH DIFFERENTIAL/PLATELET
Abs Immature Granulocytes: 0.09 10*3/uL — ABNORMAL HIGH (ref 0.00–0.07)
Basophils Absolute: 0 10*3/uL (ref 0.0–0.1)
Basophils Relative: 0 %
Eosinophils Absolute: 0 10*3/uL (ref 0.0–0.5)
Eosinophils Relative: 0 %
HCT: 33.8 % — ABNORMAL LOW (ref 36.0–46.0)
Hemoglobin: 11.5 g/dL — ABNORMAL LOW (ref 12.0–15.0)
Immature Granulocytes: 1 %
Lymphocytes Relative: 8 %
Lymphs Abs: 1.2 10*3/uL (ref 0.7–4.0)
MCH: 32.1 pg (ref 26.0–34.0)
MCHC: 34 g/dL (ref 30.0–36.0)
MCV: 94.4 fL (ref 80.0–100.0)
Monocytes Absolute: 1.9 10*3/uL — ABNORMAL HIGH (ref 0.1–1.0)
Monocytes Relative: 13 %
Neutro Abs: 11.6 10*3/uL — ABNORMAL HIGH (ref 1.7–7.7)
Neutrophils Relative %: 78 %
Platelets: 292 10*3/uL (ref 150–400)
RBC: 3.58 MIL/uL — ABNORMAL LOW (ref 3.87–5.11)
RDW: 13.4 % (ref 11.5–15.5)
WBC: 14.9 10*3/uL — ABNORMAL HIGH (ref 4.0–10.5)
nRBC: 0 % (ref 0.0–0.2)

## 2020-06-10 LAB — LACTIC ACID, PLASMA: Lactic Acid, Venous: 0.8 mmol/L (ref 0.5–1.9)

## 2020-06-10 MED ORDER — CEPHALEXIN 500 MG PO CAPS
500.0000 mg | ORAL_CAPSULE | Freq: Three times a day (TID) | ORAL | 0 refills | Status: DC
Start: 2020-06-10 — End: 2021-11-20

## 2020-06-10 MED ORDER — SODIUM CHLORIDE 0.9 % IV SOLN: INTRAVENOUS | Status: DC

## 2020-06-10 MED ORDER — ACETAMINOPHEN 325 MG PO TABS
650.0000 mg | ORAL_TABLET | Freq: Once | ORAL | Status: AC
  Administered 2020-06-10: 650 mg via ORAL
  Filled 2020-06-10: qty 2

## 2020-06-10 MED ORDER — CEPHALEXIN 500 MG PO CAPS
500.0000 mg | ORAL_CAPSULE | Freq: Once | ORAL | Status: AC
  Administered 2020-06-10: 500 mg via ORAL
  Filled 2020-06-10: qty 1

## 2020-06-10 NOTE — ED Triage Notes (Signed)
Patient BIBA from home w/ c/c of generalized weakness and malaise. Patient is normally able to ambulate without difficulty, states she has been tired and has to take walk breaks. AOx4. Denies any other symptoms  18 G AC  350 cc NS   BP 112/72 CBG 126 T 99.7 94% RA

## 2020-06-10 NOTE — ED Provider Notes (Signed)
[facility] DEPT Provider Note   CSN: [id number] Arrival date & time: 06/10/20  1812     History Chief Complaint  Patient presents with  . Weakness    Mykaila Blunck is a 69 y.o. female.  69 year old female presents with 2 days of generalized malaise.  Notes some polyuria.  He is also has some dysuria as well but denies any abdominal or back discomfort.  She has completed her Covid vaccination.  Denies any cough or congestion.  States that weakness is diffuse and not focal.  No associated vomiting or diarrhea.  No treatment use prior to arrival.        Past Medical History:  Diagnosis Date  . Anxiety   . 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 DUB (dysfunctional uterine bleeding)   . Fibroid   . Hypertension   . Nonischemic cardiomyopathy ([place])    EF 35% 2013  . Pacemaker  Medtronic    medtronic- placed 2010  . Syncope    heart rate < normal, passed out     Patient Active Problem List   Diagnosis Date Noted  . Hypertension 02/28/2014  . Routine health maintenance 02/28/2014  . 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 Nonischemic cardiomyopathy ([place])   . Fibroid   . DUB (dysfunctional uterine bleeding)   . Pacemaker- medtronic 06/28/2011  . Anxiety state 08/02/2009  . ATRIOVENTRICULAR BLOCK, 3RD DEGREE 08/02/2009  . Syncope 08/02/2009    Past Surgical History:  Procedure Laterality Date  . COLONOSCOPY  2007   Normal   . INSERT / REPLACE / REMOVE PACEMAKER    . ORIF ELBOW FRACTURE  03/17/2012   Procedure: OPEN REDUCTION INTERNAL FIXATION (ORIF) ELBOW/OLECRANON FRACTURE;  Surgeon: Roseanne Kaufman, MD;  Location: [facility];  Service: Orthopedics;  Laterality: Right;  RIGHT ELBOW ORIF WITH LIGAMENT REPAIR, RECONSTRUCTION  . PACEMAKER INSERTION  2010  . VAGINAL HYSTERECTOMY  07-06-01     OB History    Gravida  2   Para  2   Term  2   Preterm      AB      Living  1     SAB      TAB      Ectopic       Multiple      Live Births              Family History  Problem Relation Age of Onset  . Hypertension Mother   . Cancer Father        SKIN CANCER  . Diabetes Paternal Aunt        GREAT PAT AUNT  . Anesthesia problems Neg Hx     Social History   Tobacco Use  . Smoking status: Never Smoker  . Smokeless tobacco: Never Used  Vaping Use  . Vaping Use: Never used  Substance Use Topics  . Alcohol use: No  . Drug use: No    Home Medications Prior to Admission medications   Medication Sig Start Date End Date Taking? Authorizing Provider  calcium carbonate (OS-CAL) 600 MG TABS Take 600 mg by mouth 2 (two) times daily with a meal.     [provider]  carvedilol (COREG) 12.5 MG tablet Take 1 tablet (12.5 mg total) by mouth 2 (two) times daily with a meal. 11/09/19   Deboraha Sprang, MD  Multiple Vitamins-Minerals ([facility] SPECTRAVITE PO) Take 1 tablet by mouth daily.      [provider]  ramipril (ALTACE) 5 MG capsule TAKE 1 CAPSULE (5 MG TOTAL) BY MOUTH DAILY. 11/09/19   Deboraha Sprang, MD    Allergies    Prednisone  Review of Systems   Review of Systems  All other systems reviewed and are negative.   Physical Exam Updated Vital Signs BP (!) 107/47 (BP Location: Right Arm)   Pulse 86   Temp 100.3 F (37.9 C) (Oral)   Resp 16   Ht 1.6 m (5\' 3" )   Wt 49 kg   SpO2 94%   BMI 19.14 kg/m   Physical Exam Vitals and nursing note reviewed.  Constitutional:      General: She is not in acute distress.    Appearance: Normal appearance. She is well-developed. She is not toxic-appearing.  HENT:     Head: Normocephalic and atraumatic.  Eyes:     General: Lids are normal.     Conjunctiva/sclera: Conjunctivae normal.     Pupils: Pupils are equal, round, and reactive to light.  Neck:     Thyroid: No thyroid mass.     Trachea: No tracheal deviation.  Cardiovascular:     Rate and Rhythm: Normal rate and regular rhythm.     Heart sounds: Normal heart sounds.  No murmur heard.  No gallop.   Pulmonary:     Effort: Pulmonary effort is normal. No respiratory distress.     Breath sounds: Normal breath sounds. No stridor. No decreased breath sounds, wheezing, rhonchi or rales.  Abdominal:     General: Bowel sounds are normal. There is no distension.     Palpations: Abdomen is soft.     Tenderness: There is no abdominal tenderness. There is no rebound.  Musculoskeletal:        General: No tenderness. Normal range of motion.     Cervical back: Normal range of motion and neck supple.  Skin:    General: Skin is warm and dry.     Findings: No abrasion or rash.  Neurological:     Mental Status: She is alert and oriented to person, place, and time.     GCS: GCS eye subscore is 4. GCS verbal subscore is 5. GCS motor subscore is 6.     Cranial Nerves: No cranial nerve deficit.     Sensory: No sensory deficit.  Psychiatric:        Speech: Speech normal.        Behavior: Behavior normal.     ED Results / Procedures / Treatments   Labs (all labs ordered are listed, but only abnormal results are displayed) Labs Reviewed  URINE CULTURE  URINALYSIS, ROUTINE W REFLEX MICROSCOPIC  LACTIC ACID, PLASMA  CBC WITH DIFFERENTIAL/PLATELET  COMPREHENSIVE METABOLIC PANEL    EKG None  Radiology No results found.  Procedures Procedures (including critical care time)  Medications Ordered in ED Medications  0.9 %  sodium chloride infusion (has no administration in time range)  acetaminophen (TYLENOL) tablet 650 mg (has no administration in time range)    ED Course  I have reviewed the triage vital signs and the nursing notes.  Pertinent labs & imaging results that were available during my care of the patient were reviewed by me and considered in my medical decision making (see chart for details).    MDM Rules/Calculators/A&P                         Patient is urinalysis consistent with infection.  Will be given oral dose of Keflex here.  Will  discharge home Final Clinical Impression(s) / ED Diagnoses Final diagnoses:  Cough    Rx / DC Orders ED Discharge Orders    None       Lacretia Leigh, MD 06/10/20 2223

## 2020-06-13 LAB — URINE CULTURE: Culture: >=100000 — AB

## 2020-06-14 ENCOUNTER — Telehealth: Payer: Self-pay | Admitting: Emergency Medicine

## 2020-06-14 NOTE — Telephone Encounter (Signed)
Post ED Visit - Positive Culture Follow-up  Culture report reviewed by antimicrobial stewardship pharmacist: [place] Team []  Elenor Quinones, Pharm.D. []  Heide Guile, Pharm.D., BCPS AQ-ID []  Parks Neptune, Pharm.D., BCPS []  Alycia Rossetti, Pharm.D., BCPS []  [place], [place].D., BCPS, AAHIVP []  Legrand Como, Pharm.D., BCPS, AAHIVP []  Salome Arnt, PharmD, BCPS []  Johnnette Gourd, PharmD, BCPS []  Hughes Better, PharmD, BCPS []  Leeroy Cha, PharmD []  Laqueta Linden, PharmD, BCPS []  Albertina Parr, PharmD  [place] Team [x]  Leodis Sias, PharmD []  Lindell Spar, PharmD []  Royetta Asal, PharmD []  Graylin Shiver, Rph []  Rema Fendt) Glennon Mac, PharmD []  Arlyn Dunning, PharmD []  Netta Cedars, PharmD []  Dia Sitter, PharmD []  Leone Haven, PharmD []  Gretta Arab, PharmD []  Theodis Shove, PharmD []  Peggyann Juba, PharmD []  Reuel Boom, PharmD   Positive urine culture Treated with cephalexin, organism sensitive to the same and no further patient follow-up is required at this time.  Hazle Nordmann 06/14/2020, 11:34 AM

## 2020-06-15 LAB — CULTURE, BLOOD (ROUTINE X 2)
Culture: NO GROWTH
Culture: NO GROWTH
Special Requests: ADEQUATE
Special Requests: ADEQUATE

## 2020-07-12 DIAGNOSIS — M8589 Other specified disorders of bone density and structure, multiple sites: Secondary | ICD-10-CM | POA: Insufficient documentation

## 2020-08-24 ENCOUNTER — Ambulatory Visit (INDEPENDENT_AMBULATORY_CARE_PROVIDER_SITE_OTHER): Payer: BC Managed Care – PPO

## 2020-08-24 DIAGNOSIS — I442 Atrioventricular block, complete: Secondary | ICD-10-CM | POA: Diagnosis not present

## 2020-08-28 LAB — CUP PACEART REMOTE DEVICE CHECK
Battery Impedance: 2911 Ohm
Battery Remaining Longevity: 22 mo
Battery Voltage: 2.72 V
Brady Statistic AP VP Percent: 20 %
Brady Statistic AP VS Percent: 0 %
Brady Statistic AS VP Percent: 80 %
Brady Statistic AS VS Percent: 0 %
Date Time Interrogation Session: 20211029174047
Implantable Lead Implant Date: 20100701
Implantable Lead Implant Date: 20100701
Implantable Lead Location: 753859
Implantable Lead Location: 753860
Implantable Lead Model: 5076
Implantable Lead Model: 5076
Implantable Pulse Generator Implant Date: 20100701
Lead Channel Impedance Value: 498 Ohm
Lead Channel Impedance Value: 616 Ohm
Lead Channel Pacing Threshold Amplitude: 0.5 V
Lead Channel Pacing Threshold Amplitude: 0.75 V
Lead Channel Pacing Threshold Pulse Width: 0.4 ms
Lead Channel Pacing Threshold Pulse Width: 0.4 ms
Lead Channel Setting Pacing Amplitude: 2 V
Lead Channel Setting Pacing Amplitude: 2.5 V
Lead Channel Setting Pacing Pulse Width: 0.4 ms
Lead Channel Setting Sensing Sensitivity: 4 mV

## 2020-08-29 NOTE — Progress Notes (Signed)
Remote pacemaker transmission.   

## 2020-11-10 ENCOUNTER — Encounter: Payer: Self-pay | Admitting: Internal Medicine

## 2020-11-10 ENCOUNTER — Other Ambulatory Visit: Payer: Self-pay

## 2020-11-10 ENCOUNTER — Ambulatory Visit: Payer: BC Managed Care – PPO | Admitting: Internal Medicine

## 2020-11-10 VITALS — BP 112/64 | HR 67 | Ht 63.0 in | Wt 105.6 lb

## 2020-11-10 DIAGNOSIS — Z950 Presence of cardiac pacemaker: Secondary | ICD-10-CM | POA: Diagnosis not present

## 2020-11-10 DIAGNOSIS — I442 Atrioventricular block, complete: Secondary | ICD-10-CM

## 2020-11-10 DIAGNOSIS — I428 Other cardiomyopathies: Secondary | ICD-10-CM

## 2020-11-10 NOTE — Progress Notes (Signed)
Melissa [name]      Patient Care Team: Bernerd Limbo, MD as PCP - General (Family Medicine)   HPI  Melissa [name] is a 70 y.o. female is seen in followup for a Medtronic  pacemaker implanted  for complete heart block in setting of mild nonischemic cardiomyopathy, ejection fraction 35-40%. Cardiac catheterization demonstrated nonobstructive coronary disease.  The patient denies chest pain, shortness of breath, nocturnal dyspnea, orthopnea or peripheral edema.  There have been no palpitations, lightheadedness or syncope.    DATE TEST EF   1/13 Echo   30-35 %   2/18 Echo  50-55%    Date Cr K Hgb  2/18 0.71 4.3   2/18  0.82 3.6   8/21 0.94 3.8 11.5 <<13.6 (2014)        Paralegal at Nurse, adult office  Past Medical History:  Diagnosis Date  . Anxiety   . 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 DUB (dysfunctional uterine bleeding)   . Fibroid   . Hypertension   . Nonischemic cardiomyopathy ([place])    EF 35% 2013  . Pacemaker  Medtronic    medtronic- placed 2010  . Syncope    heart rate < normal, passed out     Past Surgical History:  Procedure Laterality Date  . COLONOSCOPY  2007   Normal   . INSERT / REPLACE / REMOVE PACEMAKER    . ORIF ELBOW FRACTURE  03/17/2012   Procedure: OPEN REDUCTION INTERNAL FIXATION (ORIF) ELBOW/OLECRANON FRACTURE;  Surgeon: Roseanne Kaufman, MD;  Location: [facility];  Service: Orthopedics;  Laterality: Right;  RIGHT ELBOW ORIF WITH LIGAMENT REPAIR, RECONSTRUCTION  . PACEMAKER INSERTION  2010  . VAGINAL HYSTERECTOMY  07-06-01    Current Outpatient Medications  Medication Sig Dispense Refill  . atorvastatin (LIPITOR) 20 MG tablet Take 20 mg by mouth daily.    . calcium carbonate (OS-CAL) 600 MG TABS Take 600 mg by mouth 2 (two) times daily with a meal.    . carvedilol (COREG) 12.5 MG tablet Take 1 tablet (12.5 mg total) by mouth 2 (two) times daily with a meal. 180 tablet 3  . cephALEXin (KEFLEX) 500 MG capsule Take 1 capsule (500 mg total) by  mouth 3 (three) times daily. 20 capsule 0  . Multiple Vitamins-Minerals ([employer] SPECTRAVITE PO) Take 1 tablet by mouth daily.    . ramipril (ALTACE) 5 MG capsule TAKE 1 CAPSULE (5 MG TOTAL) BY MOUTH DAILY. 90 capsule 3   No current facility-administered medications for this visit.    Allergies  Allergen Reactions  . Prednisone     REACTION: GI distress    Review of Systems negative except from HPI and PMH  Physical Exam BP 112/64   Pulse 67   Ht 5\' 3"  (1.6 m)   Wt 105 lb 9.6 oz (47.9 kg)   SpO2 96%   BMI 18.71 kg/m  Well developed and well nourished in no acute distress HENT normal Neck supple with JVP-flat Clear Device pocket well healed; without hematoma or erythema.  There is no tethering  Regular rate and rhythm, no   murmur Abd-soft with active BS No Clubbing cyanosis   edema Skin-warm and dry A & Oriented  Grossly normal sensory and motor function  ECG sinus @ 67 P-synchronous/ AV  pacing        Assessment and  Plan  Nonischemic cardiomyopathy   Complete heart block  Pacemaker-dual-chamber-Medtronic The patient's device was interrogated.  The information was reviewed. No changes were  made in the programming.     CHF class 2  Euvolemic continue current meds Continue current meds for cardiomyopathy

## 2020-11-10 NOTE — Patient Instructions (Signed)

## 2020-11-14 LAB — CUP PACEART INCLINIC DEVICE CHECK
Battery Impedance: 3096 Ohm
Battery Remaining Longevity: 21 mo
Battery Voltage: 2.71 V
Brady Statistic AP VP Percent: 19 %
Brady Statistic AP VS Percent: 0 %
Brady Statistic AS VP Percent: 81 %
Brady Statistic AS VS Percent: 0 %
Date Time Interrogation Session: 20220114162700
Implantable Lead Implant Date: 20100701
Implantable Lead Implant Date: 20100701
Implantable Lead Location: 753859
Implantable Lead Location: 753860
Implantable Lead Model: 5076
Implantable Lead Model: 5076
Implantable Pulse Generator Implant Date: 20100701
Lead Channel Impedance Value: 509 Ohm
Lead Channel Impedance Value: 638 Ohm
Lead Channel Pacing Threshold Amplitude: 0.5 V
Lead Channel Pacing Threshold Amplitude: 0.75 V
Lead Channel Pacing Threshold Pulse Width: 0.4 ms
Lead Channel Pacing Threshold Pulse Width: 0.4 ms
Lead Channel Sensing Intrinsic Amplitude: 0.7 mV
Lead Channel Setting Pacing Amplitude: 2 V
Lead Channel Setting Pacing Amplitude: 2.5 V
Lead Channel Setting Pacing Pulse Width: 0.4 ms
Lead Channel Setting Sensing Sensitivity: 4 mV

## 2020-11-23 ENCOUNTER — Ambulatory Visit (INDEPENDENT_AMBULATORY_CARE_PROVIDER_SITE_OTHER): Payer: BC Managed Care – PPO

## 2020-11-23 DIAGNOSIS — I428 Other cardiomyopathies: Secondary | ICD-10-CM

## 2020-11-24 LAB — CUP PACEART REMOTE DEVICE CHECK
Battery Impedance: 3133 Ohm
Battery Remaining Longevity: 20 mo
Battery Voltage: 2.7 V
Brady Statistic AP VP Percent: 15 %
Brady Statistic AP VS Percent: 0 %
Brady Statistic AS VP Percent: 84 %
Brady Statistic AS VS Percent: 0 %
Date Time Interrogation Session: 20220127061716
Implantable Lead Implant Date: 20100701
Implantable Lead Implant Date: 20100701
Implantable Lead Location: 753859
Implantable Lead Location: 753860
Implantable Lead Model: 5076
Implantable Lead Model: 5076
Implantable Pulse Generator Implant Date: 20100701
Lead Channel Impedance Value: 495 Ohm
Lead Channel Impedance Value: 625 Ohm
Lead Channel Pacing Threshold Amplitude: 0.5 V
Lead Channel Pacing Threshold Amplitude: 0.75 V
Lead Channel Pacing Threshold Pulse Width: 0.4 ms
Lead Channel Pacing Threshold Pulse Width: 0.4 ms
Lead Channel Setting Pacing Amplitude: 2 V
Lead Channel Setting Pacing Amplitude: 2.5 V
Lead Channel Setting Pacing Pulse Width: 0.4 ms
Lead Channel Setting Sensing Sensitivity: 4 mV

## 2020-12-04 ENCOUNTER — Other Ambulatory Visit: Payer: Self-pay

## 2020-12-04 MED ORDER — CARVEDILOL 12.5 MG PO TABS: 12.5000 mg | ORAL_TABLET | Freq: Two times a day (BID) | ORAL | 3 refills | Status: DC

## 2020-12-04 MED ORDER — RAMIPRIL 5 MG PO CAPS: ORAL_CAPSULE | ORAL | 3 refills | Status: DC

## 2020-12-04 NOTE — Progress Notes (Signed)
Remote pacemaker transmission.   

## 2021-02-22 ENCOUNTER — Ambulatory Visit (INDEPENDENT_AMBULATORY_CARE_PROVIDER_SITE_OTHER): Payer: BC Managed Care – PPO

## 2021-02-22 DIAGNOSIS — I442 Atrioventricular block, complete: Secondary | ICD-10-CM | POA: Diagnosis not present

## 2021-02-22 LAB — CUP PACEART REMOTE DEVICE CHECK
Battery Impedance: 3673 Ohm
Battery Remaining Longevity: 16 mo
Battery Voltage: 2.69 V
Brady Statistic AP VP Percent: 16 %
Brady Statistic AP VS Percent: 0 %
Brady Statistic AS VP Percent: 84 %
Brady Statistic AS VS Percent: 0 %
Date Time Interrogation Session: 20220428063055
Implantable Lead Implant Date: 20100701
Implantable Lead Implant Date: 20100701
Implantable Lead Location: 753859
Implantable Lead Location: 753860
Implantable Lead Model: 5076
Implantable Lead Model: 5076
Implantable Pulse Generator Implant Date: 20100701
Lead Channel Impedance Value: 497 Ohm
Lead Channel Impedance Value: 624 Ohm
Lead Channel Pacing Threshold Amplitude: 0.5 V
Lead Channel Pacing Threshold Amplitude: 0.75 V
Lead Channel Pacing Threshold Pulse Width: 0.4 ms
Lead Channel Pacing Threshold Pulse Width: 0.4 ms
Lead Channel Setting Pacing Amplitude: 2 V
Lead Channel Setting Pacing Amplitude: 2.5 V
Lead Channel Setting Pacing Pulse Width: 0.4 ms
Lead Channel Setting Sensing Sensitivity: 4 mV

## 2021-03-15 NOTE — Progress Notes (Signed)
Remote pacemaker transmission.   

## 2021-05-24 ENCOUNTER — Ambulatory Visit (INDEPENDENT_AMBULATORY_CARE_PROVIDER_SITE_OTHER): Payer: BC Managed Care – PPO

## 2021-05-24 DIAGNOSIS — I442 Atrioventricular block, complete: Secondary | ICD-10-CM | POA: Diagnosis not present

## 2021-05-25 LAB — CUP PACEART REMOTE DEVICE CHECK
Battery Impedance: 4040 Ohm
Battery Remaining Longevity: 13 mo
Battery Voltage: 2.68 V
Brady Statistic AP VP Percent: 18 %
Brady Statistic AP VS Percent: 0 %
Brady Statistic AS VP Percent: 82 %
Brady Statistic AS VS Percent: 0 %
Date Time Interrogation Session: 20220728065432
Implantable Lead Implant Date: 20100701
Implantable Lead Implant Date: 20100701
Implantable Lead Location: 753859
Implantable Lead Location: 753860
Implantable Lead Model: 5076
Implantable Lead Model: 5076
Implantable Pulse Generator Implant Date: 20100701
Lead Channel Impedance Value: 513 Ohm
Lead Channel Impedance Value: 641 Ohm
Lead Channel Pacing Threshold Amplitude: 0.5 V
Lead Channel Pacing Threshold Amplitude: 0.75 V
Lead Channel Pacing Threshold Pulse Width: 0.4 ms
Lead Channel Pacing Threshold Pulse Width: 0.4 ms
Lead Channel Setting Pacing Amplitude: 2 V
Lead Channel Setting Pacing Amplitude: 2.5 V
Lead Channel Setting Pacing Pulse Width: 0.4 ms
Lead Channel Setting Sensing Sensitivity: 4 mV

## 2021-06-19 NOTE — Progress Notes (Signed)
Remote pacemaker transmission.   

## 2021-07-20 ENCOUNTER — Telehealth: Payer: Self-pay | Admitting: Psychiatry

## 2021-07-20 NOTE — Telephone Encounter (Signed)
Is this your patient?

## 2021-07-20 NOTE — Telephone Encounter (Signed)
Pt says she wanted to know if you would call her to discuss current issue with Melissa [name], who is upset that Jenny Reichmann won't talk to him.  Jenny Reichmann is in his room all day with his tablet and Melissa [name] is non existent in [name]'s eyes.  If she needs to make an appt, she will.

## 2021-07-23 NOTE — Telephone Encounter (Signed)
Sorry I shouldn't have sent it to [facility]..I guess I wasn't thinking.Marland KitchenMarland Kitchen

## 2021-08-23 ENCOUNTER — Telehealth: Payer: Self-pay

## 2021-08-23 ENCOUNTER — Ambulatory Visit (INDEPENDENT_AMBULATORY_CARE_PROVIDER_SITE_OTHER): Payer: BC Managed Care – PPO

## 2021-08-23 DIAGNOSIS — I442 Atrioventricular block, complete: Secondary | ICD-10-CM | POA: Diagnosis not present

## 2021-08-23 LAB — CUP PACEART REMOTE DEVICE CHECK
Battery Impedance: 4704 Ohm
Battery Remaining Longevity: 9 mo
Battery Voltage: 2.65 V
Brady Statistic AP VP Percent: 18 %
Brady Statistic AP VS Percent: 0 %
Brady Statistic AS VP Percent: 82 %
Brady Statistic AS VS Percent: 0 %
Date Time Interrogation Session: 20221027061026
Implantable Lead Implant Date: 20100701
Implantable Lead Implant Date: 20100701
Implantable Lead Location: 753859
Implantable Lead Location: 753860
Implantable Lead Model: 5076
Implantable Lead Model: 5076
Implantable Pulse Generator Implant Date: 20100701
Lead Channel Impedance Value: 502 Ohm
Lead Channel Impedance Value: 657 Ohm
Lead Channel Pacing Threshold Amplitude: 0.375 V
Lead Channel Pacing Threshold Amplitude: 0.875 V
Lead Channel Pacing Threshold Pulse Width: 0.4 ms
Lead Channel Pacing Threshold Pulse Width: 0.4 ms
Lead Channel Setting Pacing Amplitude: 2 V
Lead Channel Setting Pacing Amplitude: 2.5 V
Lead Channel Setting Pacing Pulse Width: 0.4 ms
Lead Channel Setting Sensing Sensitivity: 4 mV

## 2021-08-23 NOTE — Telephone Encounter (Signed)
Scheduled remote reviewed. Normal device function.   The device estimates 9 months until ERI, should start IFU, sent to triage Next remote 91 days. Kathy Breach, RN, CCDS, [employer]  Remote reviewed. Appears per pacing percentage patient is dependent. Monthly remotes scheduled. Successful telephone call to patient to update. Per patient she is a manual send. Dates for transmission provided. Confirmed patient appointment with Dr. Caryl Comes 11/22/21 at 2:15 pm. Patient appreciative of call and update.

## 2021-08-30 NOTE — Progress Notes (Signed)
Remote pacemaker transmission.   

## 2021-09-15 ENCOUNTER — Other Ambulatory Visit: Payer: Self-pay | Admitting: Internal Medicine

## 2021-09-24 ENCOUNTER — Ambulatory Visit (INDEPENDENT_AMBULATORY_CARE_PROVIDER_SITE_OTHER): Payer: BC Managed Care – PPO

## 2021-09-24 DIAGNOSIS — I442 Atrioventricular block, complete: Secondary | ICD-10-CM

## 2021-09-25 LAB — CUP PACEART REMOTE DEVICE CHECK
Battery Impedance: 5206 Ohm
Battery Remaining Longevity: 7 mo
Battery Voltage: 2.63 V
Brady Statistic AP VP Percent: 18 %
Brady Statistic AP VS Percent: 0 %
Brady Statistic AS VP Percent: 82 %
Brady Statistic AS VS Percent: 0 %
Date Time Interrogation Session: 20221128062027
Implantable Lead Implant Date: 20100701
Implantable Lead Implant Date: 20100701
Implantable Lead Location: 753859
Implantable Lead Location: 753860
Implantable Lead Model: 5076
Implantable Lead Model: 5076
Implantable Pulse Generator Implant Date: 20100701
Lead Channel Impedance Value: 518 Ohm
Lead Channel Impedance Value: 663 Ohm
Lead Channel Pacing Threshold Amplitude: 0.375 V
Lead Channel Pacing Threshold Amplitude: 0.75 V
Lead Channel Pacing Threshold Pulse Width: 0.4 ms
Lead Channel Pacing Threshold Pulse Width: 0.4 ms
Lead Channel Setting Pacing Amplitude: 2 V
Lead Channel Setting Pacing Amplitude: 2.5 V
Lead Channel Setting Pacing Pulse Width: 0.4 ms
Lead Channel Setting Sensing Sensitivity: 4 mV

## 2021-10-02 NOTE — Addendum Note (Signed)
Addended by: Douglass Rivers D on: 10/02/2021 11:29 AM   Modules accepted: Level of Service

## 2021-10-02 NOTE — Progress Notes (Signed)
Remote pacemaker transmission.   

## 2021-10-03 NOTE — Progress Notes (Signed)
Monthly battery check already scheduled.

## 2021-10-25 ENCOUNTER — Ambulatory Visit (INDEPENDENT_AMBULATORY_CARE_PROVIDER_SITE_OTHER): Payer: BC Managed Care – PPO

## 2021-10-25 DIAGNOSIS — I442 Atrioventricular block, complete: Secondary | ICD-10-CM

## 2021-10-25 LAB — CUP PACEART REMOTE DEVICE CHECK
Battery Impedance: 5343 Ohm
Battery Remaining Longevity: 5 mo
Battery Voltage: 2.63 V
Brady Statistic AP VP Percent: 18 %
Brady Statistic AP VS Percent: 0 %
Brady Statistic AS VP Percent: 82 %
Brady Statistic AS VS Percent: 0 %
Date Time Interrogation Session: 20221229052934
Implantable Lead Implant Date: 20100701
Implantable Lead Implant Date: 20100701
Implantable Lead Location: 753859
Implantable Lead Location: 753860
Implantable Lead Model: 5076
Implantable Lead Model: 5076
Implantable Pulse Generator Implant Date: 20100701
Lead Channel Impedance Value: 490 Ohm
Lead Channel Impedance Value: 614 Ohm
Lead Channel Pacing Threshold Amplitude: 0.5 V
Lead Channel Pacing Threshold Amplitude: 0.75 V
Lead Channel Pacing Threshold Pulse Width: 0.4 ms
Lead Channel Pacing Threshold Pulse Width: 0.4 ms
Lead Channel Setting Pacing Amplitude: 2 V
Lead Channel Setting Pacing Amplitude: 2.5 V
Lead Channel Setting Pacing Pulse Width: 0.4 ms
Lead Channel Setting Sensing Sensitivity: 4 mV

## 2021-11-06 NOTE — Progress Notes (Signed)
Remote pacemaker transmission.   

## 2021-11-19 DIAGNOSIS — I509 Heart failure, unspecified: Secondary | ICD-10-CM | POA: Insufficient documentation

## 2021-11-20 ENCOUNTER — Ambulatory Visit: Payer: BC Managed Care – PPO | Admitting: Internal Medicine

## 2021-11-20 ENCOUNTER — Encounter: Payer: Self-pay | Admitting: Internal Medicine

## 2021-11-20 ENCOUNTER — Other Ambulatory Visit: Payer: Self-pay

## 2021-11-20 VITALS — BP 90/50 | HR 71 | Ht 63.0 in | Wt 112.0 lb

## 2021-11-20 DIAGNOSIS — Z950 Presence of cardiac pacemaker: Secondary | ICD-10-CM | POA: Diagnosis not present

## 2021-11-20 DIAGNOSIS — I428 Other cardiomyopathies: Secondary | ICD-10-CM

## 2021-11-20 DIAGNOSIS — I509 Heart failure, unspecified: Secondary | ICD-10-CM | POA: Diagnosis not present

## 2021-11-20 DIAGNOSIS — I442 Atrioventricular block, complete: Secondary | ICD-10-CM

## 2021-11-20 NOTE — Patient Instructions (Addendum)
Medication Instructions:  Your physician has recommended you make the following change in your medication:   ** Stop Atorvastatin  *If you need a refill on your cardiac medications before your next appointment, please call your pharmacy*   Lab Work: None ordered.  If you have labs (blood work) drawn today and your tests are completely normal, you will receive your results only by: [facility] (if you have MyChart) OR A paper copy in the mail If you have any lab test that is abnormal or we need to change your treatment, we will call you to review the results.   Testing/Procedures: Your physician has requested that you have an echocardiogram. Echocardiography is a painless test that uses sound waves to create images of your heart. It provides your doctor with information about the size and shape of your heart and how well your hearts chambers and valves are working. This procedure takes approximately one hour. There are no restrictions for this procedure.    Follow-Up: At [facility], you and your health needs are our priority.  As part of our continuing mission to provide you with exceptional heart care, we have created designated Provider Care Teams.  These Care Teams include your primary Cardiologist (physician) and Advanced Practice Providers (APPs -  Physician Assistants and Nurse Practitioners) who all work together to provide you with the care you need, when you need it.  We recommend signing up for the patient portal called "MyChart".  Sign up information is provided on this After Visit Summary.  MyChart is used to connect with patients for Virtual Visits (Telemedicine).  Patients are able to view lab/test results, encounter notes, upcoming appointments, etc.  Non-urgent messages can be sent to your provider as well.   To learn more about what you can do with MyChart, go to [email or web address].    Your next appointment:   To be determined as pt is near [facility]

## 2021-11-20 NOTE — Progress Notes (Signed)
Patient Care Team: Bernerd Limbo, MD as PCP - General (Family Medicine)   HPI  Melissa [name] is a 71 y.o. female is seen in followup for a Medtronic  pacemaker implanted 2010 for complete heart block in setting of mild nonischemic cardiomyopathy, ejection fraction 35-40%. Cardiac catheterization demonstrated nonobstructive coronary disease.  The patient denies chest pain, shortness of breath, nocturnal dyspnea, orthopnea or peripheral edema.  There have been no palpitations, lightheadedness or syncope.    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  Past Medical History:  Diagnosis Date   Anxiety    Complete heart block (HCC)    3rd degree   DUB (dysfunctional uterine bleeding)    Fibroid    Hypertension    Nonischemic cardiomyopathy ([place])    EF 35% 2013   Pacemaker  Medtronic    medtronic- placed 2010   Syncope    heart rate < normal, passed out     Past Surgical History:  Procedure Laterality Date   COLONOSCOPY  2007   Normal    INSERT / REPLACE / REMOVE PACEMAKER     ORIF ELBOW FRACTURE  03/17/2012   Procedure: OPEN REDUCTION INTERNAL FIXATION (ORIF) ELBOW/OLECRANON FRACTURE;  Surgeon: Roseanne Kaufman, MD;  Location: [facility];  Service: Orthopedics;  Laterality: Right;  RIGHT ELBOW ORIF WITH LIGAMENT REPAIR, RECONSTRUCTION   PACEMAKER INSERTION  2010   VAGINAL HYSTERECTOMY  07-06-01    Current Outpatient Medications  Medication Sig Dispense Refill   atorvastatin (LIPITOR) 20 MG tablet Take 20 mg by mouth daily.     calcium carbonate (OS-CAL) 600 MG TABS Take 600 mg by mouth 2 (two) times daily with a meal.     carvedilol (COREG) 12.5 MG tablet Take 1 tablet (12.5 mg total) by mouth 2 (two) times daily with a meal.  Please schedule an appt. With Dr. Caryl Comes in order to receive future refills. Thank You 180 tablet 0   Multiple Vitamins-Minerals ([employer] SPECTRAVITE PO) Take 1 tablet by mouth daily.     ramipril (ALTACE) 5 MG capsule TAKE 1 CAPSULE (5 MG TOTAL) BY MOUTH DAILY. 90 capsule 3   No current facility-administered medications for this visit.    Allergies  Allergen Reactions   Prednisone     REACTION: GI distress    Review of Systems negative except from HPI and PMH  Physical Exam BP (!) 90/50 (BP Location: Left Arm, Patient Position: Sitting, Cuff Size: Normal)    Pulse 71    Ht 5\' 3"  (1.6 m)    Wt 112 lb (50.8 kg)    SpO2 96%    BMI 19.84 kg/m  Well developed and well nourished in no acute distress HENT normal Neck supple with JVP-flat Clear Device pocket well healed; without hematoma or erythema.  There is no tethering  Regular rate and rhythm, no  murmur Abd-soft with active BS No Clubbing cyanosis  edema Skin-warm and dry A & Oriented  Grossly normal sensory and motor function    ECG sinus at 71 with P synchronous pacing  19//16/45  Assessment and  Plan  Nonischemic cardiomyopathy   Complete heart block  Pacemaker-dual-chamber-Medtronic T   CHF class 2  Hyperlipidemia   No symptoms of heart failure.  We will continue her ramipril at 10 and her carvedilol 12.5 twice daily.  I will send her blood pressure of 90, she is having no symptoms.  We will undertake an echocardiogram to assess for pacemaker associated cardiomyopathy, last echo was about 5 years ago.  Her primary care physician has started her on a statin.  Applying her free therapy data to the [facility] risk calculator she comes about 5.4%.  Generally under 5% is not treated, and many people under 10% are also not treated given the low absolute risk and the low absolute benefit.  Hence, after discussing we will discontinue her atorvastatin  Her device is reaching ERI discussed the potential implications of asynchronous  ventricular pacing.  We will obtain an echocardiogram as noted above in the event that we will need to revise her device with the resynchronize the lead either left bundle branch area or CRT

## 2021-11-22 ENCOUNTER — Encounter: Payer: BC Managed Care – PPO | Admitting: Internal Medicine

## 2021-11-26 ENCOUNTER — Ambulatory Visit (INDEPENDENT_AMBULATORY_CARE_PROVIDER_SITE_OTHER): Payer: BC Managed Care – PPO

## 2021-11-26 DIAGNOSIS — I428 Other cardiomyopathies: Secondary | ICD-10-CM | POA: Diagnosis not present

## 2021-11-26 LAB — CUP PACEART REMOTE DEVICE CHECK
Battery Impedance: 6342 Ohm
Battery Remaining Longevity: 1 mo
Battery Voltage: 2.6 V
Brady Statistic AP VP Percent: 19 %
Brady Statistic AP VS Percent: 0 %
Brady Statistic AS VP Percent: 81 %
Brady Statistic AS VS Percent: 0 %
Date Time Interrogation Session: 20230130064747
Implantable Lead Implant Date: 20100701
Implantable Lead Implant Date: 20100701
Implantable Lead Location: 753859
Implantable Lead Location: 753860
Implantable Lead Model: 5076
Implantable Lead Model: 5076
Implantable Pulse Generator Implant Date: 20100701
Lead Channel Impedance Value: 498 Ohm
Lead Channel Impedance Value: 659 Ohm
Lead Channel Pacing Threshold Amplitude: 0.5 V
Lead Channel Pacing Threshold Amplitude: 0.875 V
Lead Channel Pacing Threshold Pulse Width: 0.4 ms
Lead Channel Pacing Threshold Pulse Width: 0.4 ms
Lead Channel Setting Pacing Amplitude: 2 V
Lead Channel Setting Pacing Amplitude: 2.5 V
Lead Channel Setting Pacing Pulse Width: 0.4 ms
Lead Channel Setting Sensing Sensitivity: 4 mV

## 2021-11-29 ENCOUNTER — Ambulatory Visit (HOSPITAL_COMMUNITY): Payer: BC Managed Care – PPO | Attending: Cardiology

## 2021-11-29 ENCOUNTER — Other Ambulatory Visit: Payer: Self-pay

## 2021-11-29 DIAGNOSIS — I428 Other cardiomyopathies: Secondary | ICD-10-CM | POA: Insufficient documentation

## 2021-11-29 LAB — ECHOCARDIOGRAM COMPLETE
Area-P 1/2: 2.85 cm2
P 1/2 time: 598 msec
S' Lateral: 2.7 cm

## 2021-12-04 NOTE — Progress Notes (Signed)
Remote pacemaker transmission.   

## 2021-12-27 ENCOUNTER — Ambulatory Visit (INDEPENDENT_AMBULATORY_CARE_PROVIDER_SITE_OTHER): Payer: BC Managed Care – PPO

## 2021-12-27 DIAGNOSIS — I428 Other cardiomyopathies: Secondary | ICD-10-CM

## 2021-12-28 ENCOUNTER — Telehealth: Payer: Self-pay

## 2021-12-28 LAB — CUP PACEART REMOTE DEVICE CHECK
Battery Impedance: 7488 Ohm
Battery Voltage: 2.6 V
Brady Statistic RV Percent Paced: 100 %
Date Time Interrogation Session: 20230302055119
Implantable Lead Implant Date: 20100701
Implantable Lead Implant Date: 20100701
Implantable Lead Location: 753859
Implantable Lead Location: 753860
Implantable Lead Model: 5076
Implantable Lead Model: 5076
Implantable Pulse Generator Implant Date: 20100701
Lead Channel Impedance Value: 500 Ohm
Lead Channel Impedance Value: 67 Ohm
Lead Channel Setting Pacing Amplitude: 2.5 V
Lead Channel Setting Pacing Pulse Width: 0.4 ms
Lead Channel Setting Sensing Sensitivity: 4 mV

## 2021-12-28 NOTE — Telephone Encounter (Signed)
Spoke with patient informed her pacemaker had reached ERI on 12/09/21, patient agreeable to apt on 02/01/22 at 8AM with Tommye Standard to discuss generator change out.  ?

## 2022-01-03 NOTE — Progress Notes (Signed)
Remote pacemaker transmission.   

## 2022-01-03 NOTE — Addendum Note (Signed)
Addended by: Cheri Kearns A on: 01/03/2022 01:56 PM ? ? Modules accepted: Level of Service ? ?

## 2022-01-13 ENCOUNTER — Other Ambulatory Visit: Payer: Self-pay | Admitting: Internal Medicine

## 2022-01-17 IMAGING — CR DG CHEST 2V
2 series · 2 of 2 positions shown · non-contrast
Comparison: None.

CLINICAL DATA: Generalized weakness and malaise

EXAM:
CHEST - 2 VIEW

[w chest lat]
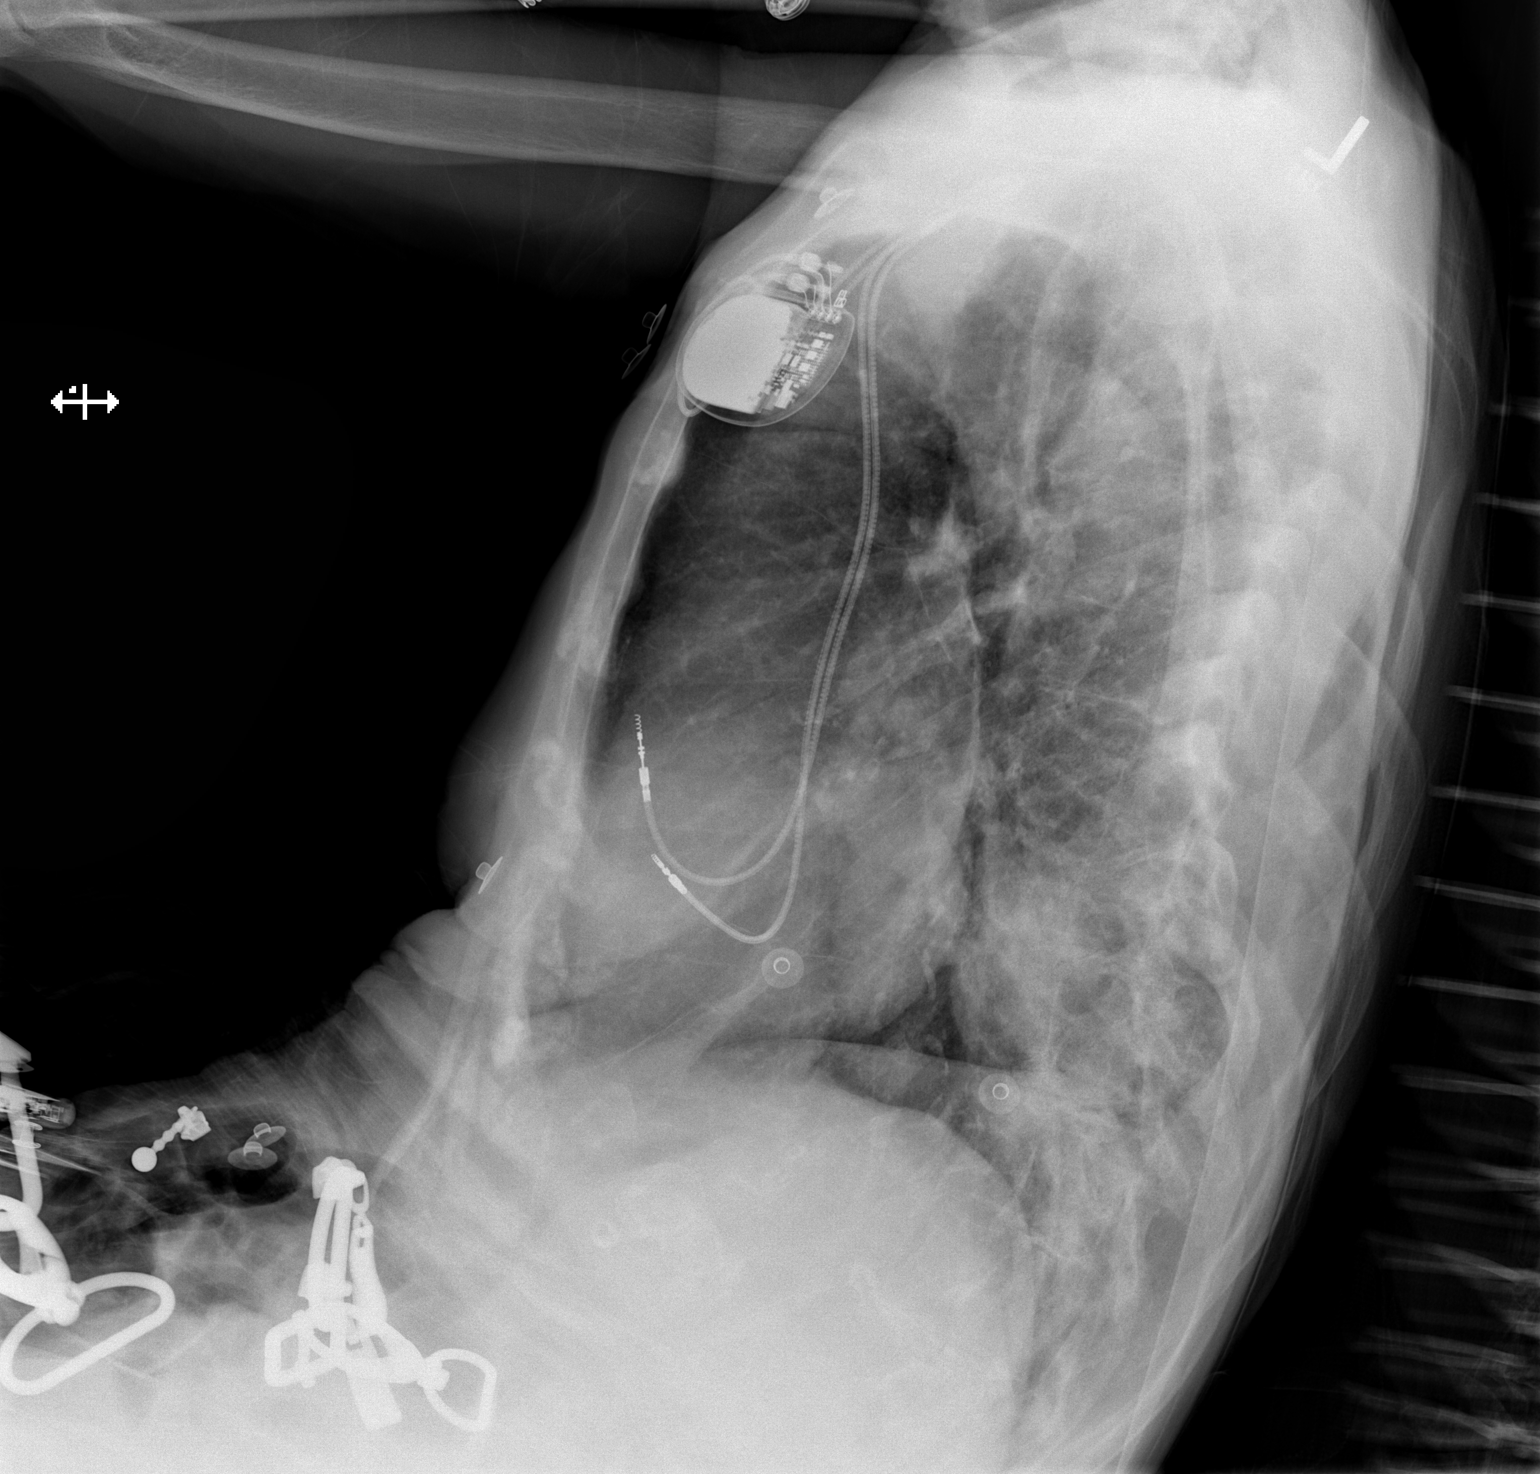

[x chest ap]
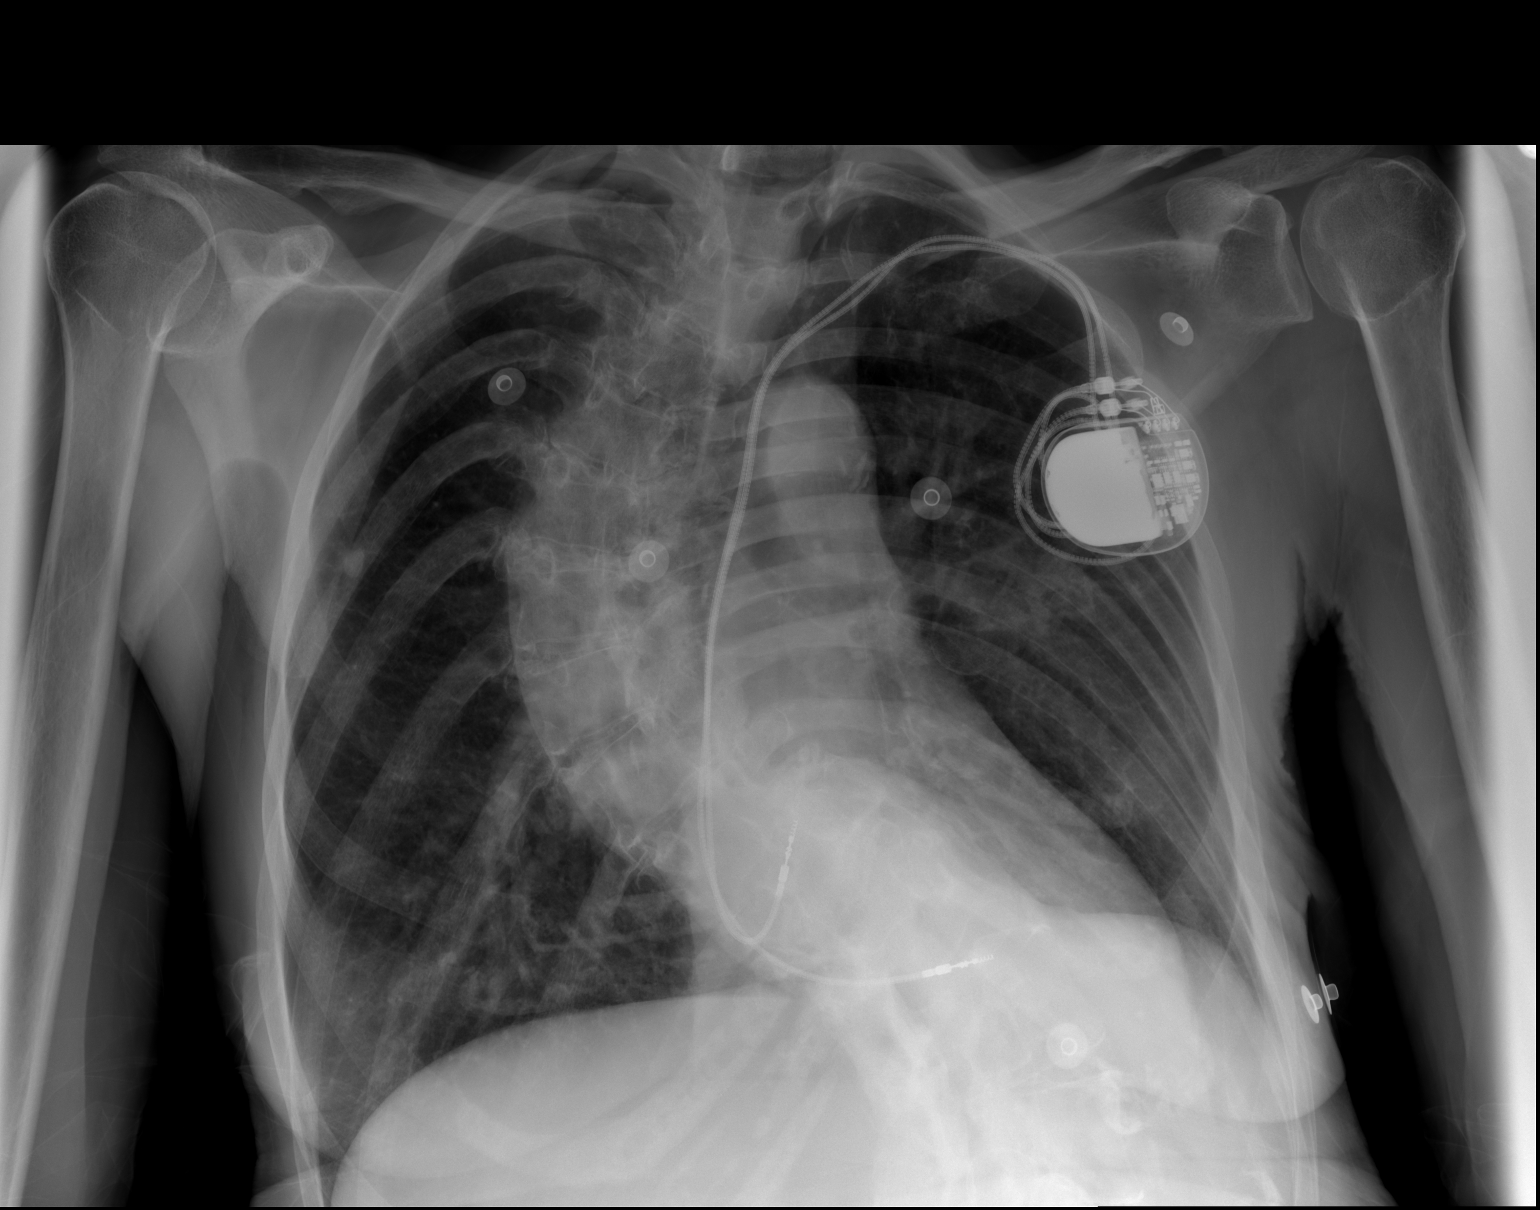

[2 of 2 positions shown; findings below may reference images not displayed]

FINDINGS: The heart size and mediastinal contours are within normal limits.
Aortic knob calcifications are seen. Again noted is a calcified
granuloma in the right upper lung. No large airspace consolidation
or pleural effusion. A left-sided pacemaker again noted. There is a
dextroconvex scoliotic curvature. No acute osseous abnormality.
IMPRESSION: No active cardiopulmonary disease.

## 2022-01-28 ENCOUNTER — Ambulatory Visit (INDEPENDENT_AMBULATORY_CARE_PROVIDER_SITE_OTHER): Payer: BC Managed Care – PPO

## 2022-01-28 DIAGNOSIS — I428 Other cardiomyopathies: Secondary | ICD-10-CM

## 2022-01-29 LAB — CUP PACEART REMOTE DEVICE CHECK
Battery Impedance: 8053 Ohm
Battery Voltage: 2.59 V
Brady Statistic RV Percent Paced: 100 %
Date Time Interrogation Session: 20230404045512
Implantable Lead Implant Date: 20100701
Implantable Lead Implant Date: 20100701
Implantable Lead Location: 753859
Implantable Lead Location: 753860
Implantable Lead Model: 5076
Implantable Lead Model: 5076
Implantable Pulse Generator Implant Date: 20100701
Lead Channel Impedance Value: 472 Ohm
Lead Channel Impedance Value: 67 Ohm
Lead Channel Setting Pacing Amplitude: 2.5 V
Lead Channel Setting Pacing Pulse Width: 0.4 ms
Lead Channel Setting Sensing Sensitivity: 4 mV

## 2022-01-31 NOTE — H&P (View-Only) (Signed)
? ?Cardiology Office Note ?Date:  01/31/2022  ?Patient ID:  Melissa [name], DOB 1951-10-26, MRN [id number] ?PCP:  Bernerd Limbo, MD  ?Electrophysiologist: Dr. Caryl Comes ? ?  ?Chief Complaint: pacer at  ERI ? ?History of Present Illness: ?Melissa [name] is a 71 y.o. female with history of NICM, chronic CHF, HTN, CHB w/PPM ? ?She comes in today to be seen for Dr. Caryl Comes, last seen by him Jan 2023.  She was doing well, her device nearing ERI.  Given low CV risk score, stopped her atorvastatin.  Planned to update her echo. ? ?TODAY ?She is doing well, has noted she does not have the same "spring in her step", otherwise doing fine. ?She worries about her husband who she says he has significant depression and is difficult. ?Denies any CP, palpitations or cardiac awareness ?No near syncope or syncope. ? ?She still works, able to get her ADLs done without difficulty, goes to the gym. ? ?Device information ?MDT dual chamber PPM implanted 04/27/2009 ? ? ?Past Medical History:  ?Diagnosis Date  ? Anxiety   ? 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 DUB (dysfunctional uterine bleeding)   ? Fibroid   ? Hypertension   ? Nonischemic cardiomyopathy ([place])   ? EF 35% 2013  ? Pacemaker  Medtronic   ? medtronic- placed 2010  ? Syncope   ? heart rate < normal, passed out   ? ? ?Past Surgical History:  ?Procedure Laterality Date  ? COLONOSCOPY  2007  ? Normal   ? INSERT / REPLACE / REMOVE PACEMAKER    ? ORIF ELBOW FRACTURE  03/17/2012  ? Procedure: OPEN REDUCTION INTERNAL FIXATION (ORIF) ELBOW/OLECRANON FRACTURE;  Surgeon: Roseanne Kaufman, MD;  Location: [place];  Service: Orthopedics;  Laterality: Right;  RIGHT ELBOW ORIF WITH LIGAMENT REPAIR, RECONSTRUCTION  ? PACEMAKER INSERTION  2010  ? VAGINAL HYSTERECTOMY  07-06-01  ? ? ?Current Outpatient Medications  ?Medication Sig Dispense Refill  ? calcium carbonate (OS-CAL) 600 MG TABS Take 600 mg by mouth 2 (two) times daily with a meal.    ? carvedilol (COREG) 12.5 MG tablet Take 1 tablet (12.5 mg  total) by mouth 2 (two) times daily with a meal. Please schedule an appt. With Dr. Caryl Comes in order to receive future refills. Thank You 180 tablet 0  ? Multiple Vitamins-Minerals ([facility] SPECTRAVITE PO) Take 1 tablet by mouth daily.    ? ramipril (ALTACE) 5 MG capsule TAKE 1 CAPSULE BY MOUTH EVERY DAY 90 capsule 3  ? ?No current facility-administered medications for this visit.  ? ? ?Allergies:   Prednisone  ? ?Social History:  The patient  reports that she has never smoked. She has never used smokeless tobacco. She reports that she does not drink alcohol and does not use drugs.  ? ?Family History:  The patient's family history includes Cancer in her father; Diabetes in her paternal aunt; Hypertension in her mother. ? ?ROS:  Please see the history of present illness.    ?All other systems are reviewed and otherwise negative.  ? ?PHYSICAL EXAM:  ?VS:  There were no vitals taken for this visit. BMI: There is no height or weight on file to calculate BMI. ?Well nourished, well developed, in no acute distress ?HEENT: normocephalic, atraumatic ?Neck: no JVD, carotid bruits or masses ?Cardiac:  RRR; no significant murmurs, no rubs, or gallops ?Lungs:  CTA b/l, no wheezing, rhonchi or rales ?Abd: soft, nontender ?MS: no deformity or, perhaps advanced atrophy ?Ext: no edema ?  Skin: warm and dry, no rash ?Neuro:  No gross deficits appreciated ?Psych: euthymic mood, full affect ? ?PPM site is stable, no tethering or discomfort ? ? ?EKG:  Done today and reviewed by myself shows  ?Asynchronous V pacing 65 ? ?Device interrogation done today and reviewed by myself:  ?Battery reached ERI2/12/23 ?No A lead data or diagnostics ?V lead measurements are good ?No HVR episodes ? ? ?11/29/2021: TTE ? 1. Left ventricular ejection fraction, by estimation, is 50 to 55%. Left  ?ventricular ejection fraction by 3D volume is 50 %. The left ventricle has  ?low normal function. The left ventricle has no regional wall motion  ?abnormalities. Left  ventricular  ?diastolic parameters are consistent with Grade I diastolic dysfunction  ?(impaired relaxation).  ? 2. Right ventricular systolic function is normal. The right ventricular  ?size is normal. Tricuspid regurgitation signal is inadequate for assessing  ?PA pressure.  ? 3. A small pericardial effusion is present. The pericardial effusion is  ?anterior to the right ventricle.  ? 4. The mitral valve is normal in structure. Mild mitral valve  ?regurgitation. No evidence of mitral stenosis.  ? 5. The aortic valve is tricuspid. Aortic valve regurgitation is mild to  ?moderate. Aortic valve sclerosis/calcification is present, without any  ?evidence of aortic stenosis.  ? 6. The inferior vena cava is normal in size with greater than 50%  ?respiratory variability, suggesting right atrial pressure of 3 mmHg.  ? ?Recent Labs: ?No results found for requested labs within last 8760 hours.  ?No results found for requested labs within last 8760 hours.  ? ?CrCl cannot be calculated (Patient's most recent lab result is older than the maximum 21 days allowed.).  ? ?Wt Readings from Last 3 Encounters:  ?11/20/21 112 lb (50.8 kg)  ?11/10/20 105 lb 9.6 oz (47.9 kg)  ?06/10/20 108 lb 0.4 oz (49 kg)  ?  ? ?Other studies reviewed: ?Additional studies/records reviewed today include: summarized above ? ?ASSESSMENT AND PLAN: ? ?PPM ? ERI has defaulted to VVI 65, she can tell the change ?We discussed the gen change procedure, potential risks benefits, she is agreeableto proceed ?She is near 1mopost ERI trigger, will get done soon ? ?HTN ?Looks ok ? ?3.   NICM ?Recovered LVEF ?On BB/ACE ?No symptoms or exam findings of volume OL ? ?Disposition: F/u with usual post gen change follow up ? ?Current medicines are reviewed at length with the patient today.  The patient did not have any concerns regarding medicines. ? ?Signed, ?RTommye Standard PA-C ?01/31/2022 6:49 AM    ? ?[facility] ?[address] ?[place] [address]?[phone]) [phone] (office)  ?[phone](fax) ? ? ?

## 2022-01-31 NOTE — Progress Notes (Signed)
? ?Cardiology Office Note ?Date:  01/31/2022  ?Patient ID:  Melissa [name], DOB [birth year], MRN [id number] ?PCP:  Bernerd Limbo, MD  ?Electrophysiologist: Dr. Caryl Comes ? ?  ?Chief Complaint: pacer at  ERI ? ?History of Present Illness: ?Melissa [name] is a 71 y.o. female with history of NICM, chronic CHF, HTN, CHB w/PPM ? ?She comes in today to be seen for Dr. Caryl Comes, last seen by him Jan 2023.  She was doing well, her device nearing ERI.  Given low CV risk score, stopped her atorvastatin.  Planned to update her echo. ? ?TODAY ?She is doing well, has noted she does not have the same "spring in her step", otherwise doing fine. ?She worries about her husband who she says he has significant depression and is difficult. ?Denies any CP, palpitations or cardiac awareness ?No near syncope or syncope. ? ?She still works, able to get her ADLs done without difficulty, goes to the gym. ? ?Device information ?MDT dual chamber PPM implanted 04/27/2009 ? ? ?Past Medical History:  ?Diagnosis Date  ? Anxiety   ? 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 DUB (dysfunctional uterine bleeding)   ? Fibroid   ? Hypertension   ? Nonischemic cardiomyopathy ([place])   ? EF 35% 2013  ? Pacemaker  Medtronic   ? medtronic- placed 2010  ? Syncope   ? heart rate < normal, passed out   ? ? ?Past Surgical History:  ?Procedure Laterality Date  ? COLONOSCOPY  2007  ? Normal   ? INSERT / REPLACE / REMOVE PACEMAKER    ? ORIF ELBOW FRACTURE  03/17/2012  ? Procedure: OPEN REDUCTION INTERNAL FIXATION (ORIF) ELBOW/OLECRANON FRACTURE;  Surgeon: Roseanne Kaufman, MD;  Location: [place];  Service: Orthopedics;  Laterality: Right;  RIGHT ELBOW ORIF WITH LIGAMENT REPAIR, RECONSTRUCTION  ? PACEMAKER INSERTION  2010  ? VAGINAL HYSTERECTOMY  07-06-01  ? ? ?Current Outpatient Medications  ?Medication Sig Dispense Refill  ? calcium carbonate (OS-CAL) 600 MG TABS Take 600 mg by mouth 2 (two) times daily with a meal.    ? carvedilol (COREG) 12.5 MG tablet Take 1 tablet (12.5 mg  total) by mouth 2 (two) times daily with a meal. Please schedule an appt. With Dr. Caryl Comes in order to receive future refills. Thank You 180 tablet 0  ? Multiple Vitamins-Minerals ([facility] SPECTRAVITE PO) Take 1 tablet by mouth daily.    ? ramipril (ALTACE) 5 MG capsule TAKE 1 CAPSULE BY MOUTH EVERY DAY 90 capsule 3  ? ?No current facility-administered medications for this visit.  ? ? ?Allergies:   Prednisone  ? ?Social History:  The patient  reports that she has never smoked. She has never used smokeless tobacco. She reports that she does not drink alcohol and does not use drugs.  ? ?Family History:  The patient's family history includes Cancer in her father; Diabetes in her paternal aunt; Hypertension in her mother. ? ?ROS:  Please see the history of present illness.    ?All other systems are reviewed and otherwise negative.  ? ?PHYSICAL EXAM:  ?VS:  There were no vitals taken for this visit. BMI: There is no height or weight on file to calculate BMI. ?Well nourished, well developed, in no acute distress ?HEENT: normocephalic, atraumatic ?Neck: no JVD, carotid bruits or masses ?Cardiac:  RRR; no significant murmurs, no rubs, or gallops ?Lungs:  CTA b/l, no wheezing, rhonchi or rales ?Abd: soft, nontender ?MS: no deformity or, perhaps advanced atrophy ?Ext: no edema ?  Skin: warm and dry, no rash ?Neuro:  No gross deficits appreciated ?Psych: euthymic mood, full affect ? ?PPM site is stable, no tethering or discomfort ? ? ?EKG:  Done today and reviewed by myself shows  ?Asynchronous V pacing 65 ? ?Device interrogation done today and reviewed by myself:  ?Battery reached ERI2/12/23 ?No A lead data or diagnostics ?V lead measurements are good ?No HVR episodes ? ? ?11/29/2021: TTE ? 1. Left ventricular ejection fraction, by estimation, is 50 to 55%. Left  ?ventricular ejection fraction by 3D volume is 50 %. The left ventricle has  ?low normal function. The left ventricle has no regional wall motion  ?abnormalities. Left  ventricular  ?diastolic parameters are consistent with Grade I diastolic dysfunction  ?(impaired relaxation).  ? 2. Right ventricular systolic function is normal. The right ventricular  ?size is normal. Tricuspid regurgitation signal is inadequate for assessing  ?PA pressure.  ? 3. A small pericardial effusion is present. The pericardial effusion is  ?anterior to the right ventricle.  ? 4. The mitral valve is normal in structure. Mild mitral valve  ?regurgitation. No evidence of mitral stenosis.  ? 5. The aortic valve is tricuspid. Aortic valve regurgitation is mild to  ?moderate. Aortic valve sclerosis/calcification is present, without any  ?evidence of aortic stenosis.  ? 6. The inferior vena cava is normal in size with greater than 50%  ?respiratory variability, suggesting right atrial pressure of 3 mmHg.  ? ?Recent Labs: ?No results found for requested labs within last 8760 hours.  ?No results found for requested labs within last 8760 hours.  ? ?CrCl cannot be calculated (Patient's most recent lab result is older than the maximum 21 days allowed.).  ? ?Wt Readings from Last 3 Encounters:  ?11/20/21 112 lb (50.8 kg)  ?11/10/20 105 lb 9.6 oz (47.9 kg)  ?06/10/20 108 lb 0.4 oz (49 kg)  ?  ? ?Other studies reviewed: ?Additional studies/records reviewed today include: summarized above ? ?ASSESSMENT AND PLAN: ? ?PPM ? ERI has defaulted to VVI 65, she can tell the change ?We discussed the gen change procedure, potential risks benefits, she is agreeableto proceed ?She is near 69mopost ERI trigger, will get done soon ? ?HTN ?Looks ok ? ?3.   NICM ?Recovered LVEF ?On BB/ACE ?No symptoms or exam findings of volume OL ? ?Disposition: F/u with usual post gen change follow up ? ?Current medicines are reviewed at length with the patient today.  The patient did not have any concerns regarding medicines. ? ?Signed, ?RTommye Standard PA-C ?01/31/2022 6:49 AM    ? ?[facility] ?[address] ?[place] [address]?[phone]) [phone] (office)  ?[phone]) [phone](fax) ? ? ?

## 2022-02-01 ENCOUNTER — Ambulatory Visit: Payer: BC Managed Care – PPO | Admitting: Physician Assistant

## 2022-02-01 ENCOUNTER — Encounter: Payer: Self-pay | Admitting: Physician Assistant

## 2022-02-01 ENCOUNTER — Telehealth: Payer: Self-pay | Admitting: Physician Assistant

## 2022-02-01 ENCOUNTER — Encounter: Payer: Self-pay | Admitting: *Deleted

## 2022-02-01 VITALS — BP 108/64 | HR 65 | Ht 64.0 in | Wt 110.0 lb

## 2022-02-01 DIAGNOSIS — Z45010 Encounter for checking and testing of cardiac pacemaker pulse generator [battery]: Secondary | ICD-10-CM

## 2022-02-01 DIAGNOSIS — I10 Essential (primary) hypertension: Secondary | ICD-10-CM | POA: Diagnosis not present

## 2022-02-01 DIAGNOSIS — I428 Other cardiomyopathies: Secondary | ICD-10-CM

## 2022-02-01 DIAGNOSIS — Z01818 Encounter for other preprocedural examination: Secondary | ICD-10-CM | POA: Diagnosis not present

## 2022-02-01 LAB — CUP PACEART INCLINIC DEVICE CHECK
Battery Impedance: 8429 Ohm
Battery Voltage: 2.59 V
Brady Statistic RV Percent Paced: 100 %
Date Time Interrogation Session: 20230407100239
Implantable Lead Implant Date: 20100701
Implantable Lead Implant Date: 20100701
Implantable Lead Location: 753859
Implantable Lead Location: 753860
Implantable Lead Model: 5076
Implantable Lead Model: 5076
Implantable Pulse Generator Implant Date: 20100701
Lead Channel Impedance Value: 478 Ohm
Lead Channel Impedance Value: 67 Ohm
Lead Channel Pacing Threshold Amplitude: 1 V
Lead Channel Pacing Threshold Pulse Width: 0.4 ms
Lead Channel Setting Pacing Amplitude: 2.5 V
Lead Channel Setting Pacing Pulse Width: 0.4 ms
Lead Channel Setting Sensing Sensitivity: 4 mV

## 2022-02-01 LAB — BASIC METABOLIC PANEL
BUN/Creatinine Ratio: 25 (ref 12–28)
BUN: 20 mg/dL (ref 8–27)
CO2: 24 mmol/L (ref 20–29)
Calcium: 9.4 mg/dL (ref 8.7–10.3)
Chloride: 106 mmol/L (ref 96–106)
Creatinine, Ser: 0.81 mg/dL (ref 0.57–1.00)
Glucose: 76 mg/dL (ref 70–99)
Potassium: 4.4 mmol/L (ref 3.5–5.2)
Sodium: 146 mmol/L — ABNORMAL HIGH (ref 134–144)
eGFR: 78 mL/min/{1.73_m2} (ref >59)

## 2022-02-01 LAB — CBC
Hematocrit: 38.6 % (ref 34.0–46.6)
Hemoglobin: 13.1 g/dL (ref 11.1–15.9)
MCH: 32 pg (ref 26.6–33.0)
MCHC: 33.9 g/dL (ref 31.5–35.7)
MCV: 94 fL (ref 79–97)
Platelets: 227 10*3/uL (ref 150–450)
RBC: 4.1 x10E6/uL (ref 3.77–5.28)
RDW: 12.4 % (ref 11.7–15.4)
WBC: 5.8 10*3/uL (ref 3.4–10.8)

## 2022-02-01 NOTE — Pre-Procedure Instructions (Signed)
Instructed patient on the following items: Arrival time 1130 Nothing to eat or drink after midnight No meds AM of procedure Responsible person to drive you home and stay with you for 24 hrs Wash with special soap night before and morning of procedure  

## 2022-02-01 NOTE — Telephone Encounter (Signed)
Patient calling to find out if she needs to have someone take her for the surgery or if can she drive herself.  ?

## 2022-02-01 NOTE — Telephone Encounter (Signed)
? ?  Pt has questions about her meds, what to take prior her procedure on monday ?

## 2022-02-01 NOTE — Telephone Encounter (Signed)
Spoke with patient about transportation for pacemaker implant procedure scheduled for 02/04/22. ? ?Advised patient that she will need transportation home after procedure, patient states she has someone who can drive her home. ? ?Patient also had a question about receiving a bill for pacemaker implant scheduled 02/04/22. Advised patient to contact our billing department by calling our office number and selecting billing in menu options to discuss billing concerns. ? ?Patient verbalized understanding and thanked me for calling. ?

## 2022-02-01 NOTE — Patient Instructions (Addendum)
Medication Instructions:  ? ? ?Your physician recommends that you continue on your current medications as directed. Please refer to the Current Medication list given to you today. ? ? ?*If you need a refill on your cardiac medications before your next appointment, please call your pharmacy* ? ? ?Lab Work: BMET AND CBC TODAY  ? ?If you have labs (blood work) drawn today and your tests are completely normal, you will receive your results only by: ?MyChart Message (if you have MyChart) OR ?A paper copy in the mail ?If you have any lab test that is abnormal or we need to change your treatment, we will call you to review the results. ? ? ?Testing/Procedures: SEE LETTER FOR DEVICE BATTERY CHANGE ON 02-04-22 ? ? ?Follow-Up: ?At [facility], you and your health needs are our priority.  As part of our continuing mission to provide you with exceptional heart care, we have created designated Provider Care Teams.  These Care Teams include your primary Cardiologist (physician) and Advanced Practice Providers (APPs -  Physician Assistants and Nurse Practitioners) who all work together to provide you with the care you need, when you need it. ? ?We recommend signing up for the patient portal called "MyChart".  Sign up information is provided on this After Visit Summary.  MyChart is used to connect with patients for Virtual Visits (Telemedicine).  Patients are able to view lab/test results, encounter notes, upcoming appointments, etc.  Non-urgent messages can be sent to your provider as well.   ?To learn more about what you can do with MyChart, go to [email or web address].   ? ?Your next appointment:  AFTER 02-04-22   14 DAY WOUND CHECK VISIT  ?                      ?  AND  ? ? ?3 month(s)  PHYS PACER CHK  ? ?The format for your next appointment:   ?In Person ? ?Provider:   ?Virl Axe, MD{ ? ? ? ?Other Instructions ? ?

## 2022-02-04 ENCOUNTER — Encounter (HOSPITAL_COMMUNITY)
Admission: RE | Disposition: A | Discharge status: Home / Self Care | Payer: Self-pay | Source: Home / Self Care | Attending: Internal Medicine

## 2022-02-04 ENCOUNTER — Other Ambulatory Visit: Payer: Self-pay

## 2022-02-04 ENCOUNTER — Ambulatory Visit (HOSPITAL_COMMUNITY)
Admission: RE | Admit: 2022-02-04 | Discharge: 2022-02-04 | Disposition: A | Discharge status: Home / Self Care | Payer: BC Managed Care – PPO | Attending: Internal Medicine | Admitting: Internal Medicine

## 2022-02-04 DIAGNOSIS — I110 Hypertensive heart disease with heart failure: Secondary | ICD-10-CM | POA: Diagnosis not present

## 2022-02-04 DIAGNOSIS — M4124 Other idiopathic scoliosis, thoracic region: Secondary | ICD-10-CM

## 2022-02-04 DIAGNOSIS — I428 Other cardiomyopathies: Secondary | ICD-10-CM | POA: Diagnosis not present

## 2022-02-04 DIAGNOSIS — Z45010 Encounter for checking and testing of cardiac pacemaker pulse generator [battery]: Secondary | ICD-10-CM | POA: Diagnosis not present

## 2022-02-04 DIAGNOSIS — I509 Heart failure, unspecified: Secondary | ICD-10-CM | POA: Diagnosis not present

## 2022-02-04 DIAGNOSIS — I442 Atrioventricular block, complete: Secondary | ICD-10-CM

## 2022-02-04 DIAGNOSIS — M419 Scoliosis, unspecified: Secondary | ICD-10-CM

## 2022-02-04 DIAGNOSIS — M4120 Other idiopathic scoliosis, site unspecified: Secondary | ICD-10-CM

## 2022-02-04 HISTORY — PX: PPM GENERATOR CHANGEOUT: EP1233

## 2022-02-04 SURGERY — PPM GENERATOR CHANGEOUT

## 2022-02-04 MED ORDER — ACETAMINOPHEN 325 MG PO TABS
325.0000 mg | ORAL_TABLET | ORAL | Status: DC | PRN
  Filled 2022-02-04: qty 2

## 2022-02-04 MED ORDER — POVIDONE-IODINE 10 % EX SWAB: 2.0000 "application " | Freq: Once | CUTANEOUS | Status: DC

## 2022-02-04 MED ORDER — CHLORHEXIDINE GLUCONATE 4 % EX LIQD: 4.0000 "application " | Freq: Once | CUTANEOUS | Status: DC

## 2022-02-04 MED ORDER — CEFAZOLIN SODIUM-DEXTROSE 2-4 GM/100ML-% IV SOLN
2.0000 g | INTRAVENOUS | Status: AC
  Administered 2022-02-04: 2 g via INTRAVENOUS

## 2022-02-04 MED ORDER — SODIUM CHLORIDE 0.9 % IV SOLN
80.0000 mg | INTRAVENOUS | Status: AC
  Administered 2022-02-04: 80 mg

## 2022-02-04 MED ORDER — CEFAZOLIN SODIUM-DEXTROSE 2-4 GM/100ML-% IV SOLN
INTRAVENOUS | Status: AC
  Filled 2022-02-04: qty 100

## 2022-02-04 MED ORDER — SODIUM CHLORIDE 0.9 % IV SOLN
INTRAVENOUS | Status: AC
  Filled 2022-02-04: qty 2

## 2022-02-04 MED ORDER — FENTANYL CITRATE (PF) 100 MCG/2ML IJ SOLN
INTRAMUSCULAR | Status: AC
  Filled 2022-02-04: qty 2

## 2022-02-04 MED ORDER — FENTANYL CITRATE (PF) 100 MCG/2ML IJ SOLN
INTRAMUSCULAR | Status: DC | PRN
  Administered 2022-02-04: 25 ug via INTRAVENOUS

## 2022-02-04 MED ORDER — MIDAZOLAM HCL 5 MG/5ML IJ SOLN
INTRAMUSCULAR | Status: DC | PRN
  Administered 2022-02-04: 2 mg via INTRAVENOUS

## 2022-02-04 MED ORDER — LIDOCAINE HCL (PF) 1 % IJ SOLN
INTRAMUSCULAR | Status: DC | PRN
  Administered 2022-02-04: 50 mL

## 2022-02-04 MED ORDER — MIDAZOLAM HCL 5 MG/5ML IJ SOLN
INTRAMUSCULAR | Status: AC
  Filled 2022-02-04: qty 5

## 2022-02-04 MED ORDER — LIDOCAINE HCL (PF) 1 % IJ SOLN
INTRAMUSCULAR | Status: AC
  Filled 2022-02-04: qty 60

## 2022-02-04 MED ORDER — SODIUM CHLORIDE 0.9 % IV SOLN: INTRAVENOUS | Status: DC

## 2022-02-04 SURGICAL SUPPLY — 6 items
CABLE SURGICAL S-101-97-12 (CABLE) ×3 IMPLANT
HEMOSTAT SURGICEL 2X4 FIBR (HEMOSTASIS) ×1 IMPLANT
IPG PACE AZUR XT DR MRI W1DR01 (Pacemaker) IMPLANT
PACE AZURE XT DR MRI W1DR01 (Pacemaker) ×2 IMPLANT
PAD DEFIB RADIO PHYSIO CONN (PAD) ×3 IMPLANT
TRAY PACEMAKER INSERTION (PACKS) ×3 IMPLANT

## 2022-02-04 NOTE — Interval H&P Note (Signed)
History and Physical Interval Note: ? ?02/04/2022 ?4:14 PM ? ?Melissa [name]  has presented today for surgery, with the diagnosis of ERI.  The various methods of treatment have been discussed with the patient and family. After consideration of risks, benefits and other options for treatment, the patient has consented to  Procedure(s): ?PPM GENERATOR CHANGEOUT (N/A) as a surgical intervention.  The patient's history has been reviewed, patient examined, no change in status, stable for surgery.  I have reviewed the patient's chart and labs.  Questions were answered to the patient's satisfaction.   ? ? ?Virl Axe ? ? ?

## 2022-02-04 NOTE — Discharge Instructions (Signed)

## 2022-02-05 ENCOUNTER — Encounter (HOSPITAL_COMMUNITY): Payer: Self-pay | Admitting: Internal Medicine

## 2022-02-12 ENCOUNTER — Other Ambulatory Visit: Payer: Self-pay

## 2022-02-12 MED ORDER — CARVEDILOL 12.5 MG PO TABS: 12.5000 mg | ORAL_TABLET | Freq: Two times a day (BID) | ORAL | 3 refills | Status: DC

## 2022-02-12 NOTE — Progress Notes (Signed)
Remote pacemaker transmission.   

## 2022-02-12 NOTE — Addendum Note (Signed)
Addended by: Cheri Kearns A on: 02/12/2022 09:29 AM ? ? Modules accepted: Level of Service ? ?

## 2022-02-20 ENCOUNTER — Ambulatory Visit (INDEPENDENT_AMBULATORY_CARE_PROVIDER_SITE_OTHER): Payer: BC Managed Care – PPO

## 2022-02-20 DIAGNOSIS — I442 Atrioventricular block, complete: Secondary | ICD-10-CM

## 2022-02-20 NOTE — Patient Instructions (Signed)
? ?  After Your Pacemaker ? ? ?Monitor your pacemaker site for redness, swelling, and drainage. Call the device clinic at [phone] if you experience these symptoms or fever/chills. ? ?Your incision was closed with Dermabond:  You may shower and wash your incision with soap and water. Avoid lotions, ointments, or perfumes over your incision until it is well-healed.  ? ?You may use a hot tub or a pool after your wound check appointment if the incision is completely closed. ? ? ?You may drive, unless driving has been restricted by your healthcare providers. ? ?Your Pacemaker is MRI compatible. ? ?Remote monitoring is used to monitor your pacemaker from home. This monitoring is scheduled every 91 days by our office. It allows [place] to keep an eye on the functioning of your device to ensure it is working properly. You will routinely see your Electrophysiologist annually (more often if necessary).  ?

## 2022-02-21 LAB — CUP PACEART INCLINIC DEVICE CHECK
Battery Remaining Longevity: 152 mo
Battery Voltage: 3.22 V
Brady Statistic AP VP Percent: 17.72 %
Brady Statistic AP VS Percent: 0 %
Brady Statistic AS VP Percent: 82.27 %
Brady Statistic AS VS Percent: 0.01 %
Brady Statistic RA Percent Paced: 17.69 %
Brady Statistic RV Percent Paced: 99.99 %
Date Time Interrogation Session: 20230426160500
Implantable Lead Implant Date: 20100701
Implantable Lead Implant Date: 20100701
Implantable Lead Location: 753859
Implantable Lead Location: 753860
Implantable Lead Model: 5076
Implantable Lead Model: 5076
Implantable Pulse Generator Implant Date: 20230410
Lead Channel Impedance Value: 399 Ohm
Lead Channel Impedance Value: 475 Ohm
Lead Channel Impedance Value: 513 Ohm
Lead Channel Impedance Value: 589 Ohm
Lead Channel Pacing Threshold Amplitude: 0.5 V
Lead Channel Pacing Threshold Amplitude: 1 V
Lead Channel Pacing Threshold Pulse Width: 0.4 ms
Lead Channel Pacing Threshold Pulse Width: 0.4 ms
Lead Channel Sensing Intrinsic Amplitude: 0.625 mV
Lead Channel Setting Pacing Amplitude: 1.5 V
Lead Channel Setting Pacing Amplitude: 2 V
Lead Channel Setting Pacing Pulse Width: 0.4 ms
Lead Channel Setting Sensing Sensitivity: 4 mV

## 2022-02-21 NOTE — Progress Notes (Signed)
Wound check appointment s/p gen change 02/04/22. Dermabond removed. Wound without redness or edema. Incision edges approximated, wound well healed. Normal device function. Thresholds, sensing, and impedances consistent with implant measurements. Device programmed at chronic lead settings. Histogram distribution appropriate for patient and level of activity. No mode switches or high ventricular rates noted. Patient educated about wound care, arm mobility, lifting restrictions. Enrolled in remote monitoring with next transmission 7/13. ROV in 3 months with implanting physician. ?

## 2022-05-09 ENCOUNTER — Ambulatory Visit (INDEPENDENT_AMBULATORY_CARE_PROVIDER_SITE_OTHER): Payer: BC Managed Care – PPO

## 2022-05-09 ENCOUNTER — Encounter: Payer: BC Managed Care – PPO | Admitting: Internal Medicine

## 2022-05-09 DIAGNOSIS — I428 Other cardiomyopathies: Secondary | ICD-10-CM

## 2022-05-09 LAB — CUP PACEART REMOTE DEVICE CHECK
Battery Remaining Longevity: 147 mo
Battery Voltage: 3.19 V
Brady Statistic AP VP Percent: 16.22 %
Brady Statistic AP VS Percent: 0 %
Brady Statistic AS VP Percent: 83.64 %
Brady Statistic AS VS Percent: 0.14 %
Brady Statistic RA Percent Paced: 16.25 %
Brady Statistic RV Percent Paced: 99.86 %
Date Time Interrogation Session: 20230712210038
Implantable Lead Implant Date: 20100701
Implantable Lead Implant Date: 20100701
Implantable Lead Location: 753859
Implantable Lead Location: 753860
Implantable Lead Model: 5076
Implantable Lead Model: 5076
Implantable Pulse Generator Implant Date: 20230410
Lead Channel Impedance Value: 342 Ohm
Lead Channel Impedance Value: 418 Ohm
Lead Channel Impedance Value: 456 Ohm
Lead Channel Impedance Value: 532 Ohm
Lead Channel Pacing Threshold Amplitude: 0.625 V
Lead Channel Pacing Threshold Amplitude: 1 V
Lead Channel Pacing Threshold Pulse Width: 0.4 ms
Lead Channel Pacing Threshold Pulse Width: 0.4 ms
Lead Channel Sensing Intrinsic Amplitude: 0.375 mV
Lead Channel Sensing Intrinsic Amplitude: 0.375 mV
Lead Channel Setting Pacing Amplitude: 1.5 V
Lead Channel Setting Pacing Amplitude: 2 V
Lead Channel Setting Pacing Pulse Width: 0.4 ms
Lead Channel Setting Sensing Sensitivity: 4 mV

## 2022-05-27 NOTE — Progress Notes (Signed)
Remote pacemaker transmission.   

## 2022-06-13 ENCOUNTER — Ambulatory Visit: Payer: BC Managed Care – PPO | Admitting: Internal Medicine

## 2022-06-13 ENCOUNTER — Encounter: Payer: Self-pay | Admitting: Internal Medicine

## 2022-06-13 VITALS — BP 110/74 | HR 72 | Ht 64.0 in | Wt 114.2 lb

## 2022-06-13 DIAGNOSIS — I428 Other cardiomyopathies: Secondary | ICD-10-CM

## 2022-06-13 DIAGNOSIS — Z950 Presence of cardiac pacemaker: Secondary | ICD-10-CM | POA: Diagnosis not present

## 2022-06-13 DIAGNOSIS — I509 Heart failure, unspecified: Secondary | ICD-10-CM

## 2022-06-13 DIAGNOSIS — I442 Atrioventricular block, complete: Secondary | ICD-10-CM | POA: Diagnosis not present

## 2022-06-13 NOTE — Progress Notes (Signed)
Patient Care Team: Bernerd Limbo, MD as PCP - General (Family Medicine)   HPI  Melissa [name] is a 71 y.o. female is seen in followup for a Medtronic  pacemaker implanted 2010 for complete heart block in setting of mild nonischemic cardiomyopathy, ejection fraction 35-40%. Cardiac catheterization demonstrated nonobstructive coronary disease.  The patient denies chest pain, shortness of breath, nocturnal dyspnea, orthopnea or peripheral edema.  There have been no palpitations, lightheadedness or syncope.    She had a fall where she thought her foot tripped.  She had residual right leg pain and right leg numbness.  This was about a year ago.  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  Past Medical History:  Diagnosis Date   Anxiety    Complete heart block ([facility])    3rd degree   DUB (dysfunctional uterine bleeding)    Fibroid    Hypertension    Nonischemic cardiomyopathy ([place])    EF 35% 2013   Pacemaker  Medtronic    medtronic- placed 2010   Syncope    heart rate < normal, passed out     Past Surgical History:  Procedure Laterality Date   COLONOSCOPY  2007   Normal    INSERT / REPLACE / REMOVE PACEMAKER     ORIF ELBOW FRACTURE  03/17/2012   Procedure: OPEN REDUCTION INTERNAL FIXATION (ORIF) ELBOW/OLECRANON FRACTURE;  Surgeon: Roseanne Kaufman, MD;  Location: [place];  Service: Orthopedics;  Laterality: Right;  RIGHT ELBOW ORIF WITH LIGAMENT REPAIR, RECONSTRUCTION   PACEMAKER INSERTION  2010   PPM GENERATOR CHANGEOUT N/A 02/04/2022   Procedure: PPM GENERATOR CHANGEOUT;  Surgeon: Deboraha Sprang, MD;  Location: [place] [facility];  Service: Cardiovascular;  Laterality: N/A;   VAGINAL HYSTERECTOMY  07-06-01    Current  Outpatient Medications  Medication Sig Dispense Refill   acetaminophen (TYLENOL) 325 MG tablet Take 325 mg by mouth every 6 (six) hours as needed for headache.     Ascorbic Acid (VITAMIN C PO) Take 1 tablet by mouth daily.     Calcium Carb-Cholecalciferol 600-20 MG-MCG TABS Take 1 tablet by mouth 2 (two) times daily.     carvedilol (COREG) 12.5 MG tablet Take 1 tablet (12.5 mg total) by mouth 2 (two) times daily with a meal. 180 tablet 3   ramipril (ALTACE) 5 MG capsule TAKE 1 CAPSULE BY MOUTH EVERY DAY 90 capsule 3   No current facility-administered medications for this visit.    Allergies  Allergen Reactions   Prednisone     GI distress    Review of Systems negative except from HPI and PMH  Physical Exam BP 110/74   Pulse 72   Ht '5\' 4"'$  (1.626 m)   Wt 114 lb 3.2 oz (51.8 kg)   SpO2 97%   BMI 19.60 kg/m  Well developed and nourished in no acute distress HENT normal Neck supple with JVP-  flat   Clear Regular rate and rhythm, no murmurs or gallops Abd-soft with active BS No Clubbing cyanosis edema Skin-warm and dry A & Oriented hypoesthesia right  distal thigh and right lateral knee Hyper reflexia right knee normal lower extremity strength  ECG sinus P-synchronous/ AV  pacing      Assessment and  Plan  Nonischemic cardiomyopathy   Complete heart block  Pacemaker-dual-chamber-Medtronic T   CHF class 2  Hyperlipidemia   R leg neuro   The patient had a fall associated with persistent tingling in her right leg with evidence of hyperreflexia.  Some issues with pain.  She is sought orthopedic help.  I suggested the [facility] orthopedic walk-in clinic.  She will avail herself of that.  After this evaluation she will let us know I have suggested that she might be benefited by MRI to see if she has had a stroke to potentially explain the aforementioned events.  Device function is normal  Blood sugar well controlled we will continue her on the carvedilol and the  Altace for her nonischemic myopathy.

## 2022-06-13 NOTE — Patient Instructions (Signed)

## 2022-06-17 ENCOUNTER — Encounter: Payer: Self-pay | Admitting: Physician Assistant

## 2022-06-25 ENCOUNTER — Ambulatory Visit: Payer: BC Managed Care – PPO | Admitting: Physician Assistant

## 2022-07-16 ENCOUNTER — Ambulatory Visit: Payer: BC Managed Care – PPO | Admitting: Physician Assistant

## 2022-07-16 ENCOUNTER — Encounter: Payer: Self-pay | Admitting: Physician Assistant

## 2022-07-16 VITALS — BP 131/74 | HR 63 | Temp 97.6°F | Ht 64.0 in | Wt 115.0 lb

## 2022-07-16 DIAGNOSIS — M8580 Other specified disorders of bone density and structure, unspecified site: Secondary | ICD-10-CM

## 2022-07-16 DIAGNOSIS — Z950 Presence of cardiac pacemaker: Secondary | ICD-10-CM

## 2022-07-16 DIAGNOSIS — Z0000 Encounter for general adult medical examination without abnormal findings: Secondary | ICD-10-CM

## 2022-07-16 DIAGNOSIS — M4120 Other idiopathic scoliosis, site unspecified: Secondary | ICD-10-CM

## 2022-07-16 DIAGNOSIS — I10 Essential (primary) hypertension: Secondary | ICD-10-CM

## 2022-07-16 DIAGNOSIS — Z131 Encounter for screening for diabetes mellitus: Secondary | ICD-10-CM

## 2022-07-16 DIAGNOSIS — Z23 Encounter for immunization: Secondary | ICD-10-CM | POA: Diagnosis not present

## 2022-07-16 DIAGNOSIS — E782 Mixed hyperlipidemia: Secondary | ICD-10-CM

## 2022-07-16 NOTE — Patient Instructions (Addendum)
Very good to meet you today! Schedule for fasting lab appointment at your convenience. Please MyChart or bring hard copy of updated vaccines from [facility] and any info you have about latest mammogram / colon cancer screenings.  [facility]  Order for bone density screening placed.  Flu shot updated today.   Keep up the great work!!

## 2022-07-16 NOTE — Progress Notes (Signed)
Subjective:    Patient ID: Melissa [name], female    DOB: 01/22/1951, 71 y.o.   MRN: [id number]  Chief Complaint  Patient presents with   Establish Care    HPI 71 y.o. patient presents today for new patient establishment with me.  Patient was previously established with Dr. Bernerd Limbo. Married. 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   [facility] in the last few weeks.  [facility]ogram done every year.   Current Care Team: Dr. Caryl Comes - cardiologist; hx of pacemaker - nonischemic cardiomyopathy  Acute Concerns: No current health issues, stays active trying to walk daily, eats well; keeps UTD with her health maintenance - need to pull in records.  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   DUB (dysfunctional uterine bleeding)    Fibroid    Hypertension    Nonischemic cardiomyopathy ([place])    EF 35% 2013   Pacemaker  Medtronic    medtronic- placed 2010   Syncope    heart rate < normal, passed out     Past Surgical History:  Procedure Laterality Date   COLONOSCOPY  2007   Normal    INSERT / REPLACE / REMOVE PACEMAKER     ORIF ELBOW FRACTURE  03/17/2012   Procedure: OPEN REDUCTION INTERNAL FIXATION (ORIF) ELBOW/OLECRANON FRACTURE;  Surgeon: Roseanne Kaufman, MD;  Location: [facility];  Service: Orthopedics;  Laterality: Right;  RIGHT ELBOW ORIF WITH LIGAMENT REPAIR, RECONSTRUCTION   PACEMAKER INSERTION  2010   PPM GENERATOR CHANGEOUT N/A 02/04/2022   Procedure: PPM GENERATOR CHANGEOUT;  Surgeon: Deboraha Sprang, MD;  Location: [facility];  Service: Cardiovascular;  Laterality: N/A;   VAGINAL HYSTERECTOMY  07-06-01    Family History  Problem Relation Age of Onset   Hypertension Mother    Cancer Father        SKIN CANCER   Diabetes Paternal Aunt        GREAT PAT AUNT   Anesthesia problems Neg Hx     Social History   Tobacco Use   Smoking  status: Never   Smokeless tobacco: Never  Vaping Use   Vaping Use: Never used  Substance Use Topics   Alcohol use: No   Drug use: No     Allergies  Allergen Reactions   Prednisone     GI distress    Review of Systems NEGATIVE UNLESS OTHERWISE INDICATED IN HPI      Objective:     BP 131/74 (BP Location: Left Arm, Patient Position: Sitting)   Pulse 63   Temp 97.6 F (36.4 C) (Temporal)   Ht '5\' 4"'$  (1.626 m)   Wt 115 lb (52.2 kg)   SpO2 98%   BMI 19.74 kg/m   Wt Readings from Last 3 Encounters:  07/16/22 115 lb (52.2 kg)  06/13/22 114 lb 3.2 oz (51.8 kg)  02/04/22 110 lb (49.9 kg)    BP Readings from Last 3 Encounters:  07/16/22 131/74  06/13/22 110/74  02/04/22 120/72     Physical Exam Vitals and nursing note reviewed.  Constitutional:      General: She is not in acute distress.    Appearance: Normal appearance. She is normal weight. She is not ill-appearing or toxic-appearing.  HENT:     Head: Normocephalic and atraumatic.     Right Ear: Tympanic membrane, ear canal and  external ear normal.     Left Ear: Tympanic membrane, ear canal and external ear normal.     Nose: Nose normal.     Mouth/Throat:     Mouth: Mucous membranes are moist.  Eyes:     Extraocular Movements: Extraocular movements intact.     Conjunctiva/sclera: Conjunctivae normal.     Pupils: Pupils are equal, round, and reactive to light.     Comments: Wears glasses  Cardiovascular:     Rate and Rhythm: Normal rate and regular rhythm.     Pulses: Normal pulses.     Heart sounds: Normal heart sounds.  Pulmonary:     Effort: Pulmonary effort is normal.     Breath sounds: Normal breath sounds.  Abdominal:     General: Abdomen is flat. Bowel sounds are normal.     Palpations: Abdomen is soft. There is no mass.     Tenderness: There is no right CVA tenderness, left CVA tenderness or guarding.  Musculoskeletal:        General: Normal range of motion.     Cervical back: Normal range of  motion and neck supple.     Right lower leg: No edema.     Left lower leg: No edema.     Comments: Significant scoliosis of spine noted  Skin:    General: Skin is warm and dry.     Findings: No rash.  Neurological:     General: No focal deficit present.     Mental Status: She is alert and oriented to person, place, and time.  Psychiatric:        Mood and Affect: Mood normal.        Behavior: Behavior normal.        Thought Content: Thought content normal.        Judgment: Judgment normal.        Assessment & Plan:  Encounter for annual physical exam -     CBC with Differential/Platelet; Future -     Comprehensive metabolic panel; Future -     Lipid panel; Future -     TSH; Future -     Hemoglobin A1c; Future  Primary hypertension -     CBC with Differential/Platelet; Future -     Comprehensive metabolic panel; Future -     Lipid panel; Future  Mixed hyperlipidemia -     Lipid panel; Future  Diabetes mellitus screening -     Comprehensive metabolic panel; Future -     Hemoglobin A1c; Future  Pacemaker- medtronic  Osteopenia, unspecified location -     DG Bone Density; Future -     CBC with Differential/Platelet; Future -     TSH; Future  Other idiopathic scoliosis, unspecified spinal region  Need for immunization against influenza -     Flu Vaccine QUAD High Dose(Fluad)   Very pleasant and overall healthy 71 yo new patient. Encouraged her to keep up good work with health maintenance and caring for herself. Age-appropriate screening and counseling performed today. Will check labs and call with results. Preventive measures discussed and printed in AVS for patient. She will bring [place] records of vaccines / mammo / colonoscopy. -New order for DEXA scan -Flu shot today -Water aerobics advised -Regular dental /vision exams -She knows to call if any concerns     Return in about 1 year (around 07/17/2023) for CPE, fasting labs .  This note was prepared with  assistance of Systems analyst. Occasional wrong-word or  sound-a-like substitutions may have occurred due to the inherent limitations of voice recognition software.   Altheria Shadoan M Breyona Swander, PA-C

## 2022-07-19 ENCOUNTER — Other Ambulatory Visit (INDEPENDENT_AMBULATORY_CARE_PROVIDER_SITE_OTHER): Payer: BC Managed Care – PPO

## 2022-07-19 DIAGNOSIS — Z131 Encounter for screening for diabetes mellitus: Secondary | ICD-10-CM | POA: Diagnosis not present

## 2022-07-19 DIAGNOSIS — I10 Essential (primary) hypertension: Secondary | ICD-10-CM

## 2022-07-19 DIAGNOSIS — Z0000 Encounter for general adult medical examination without abnormal findings: Secondary | ICD-10-CM

## 2022-07-19 DIAGNOSIS — E782 Mixed hyperlipidemia: Secondary | ICD-10-CM | POA: Diagnosis not present

## 2022-07-19 DIAGNOSIS — M8580 Other specified disorders of bone density and structure, unspecified site: Secondary | ICD-10-CM | POA: Diagnosis not present

## 2022-07-19 LAB — LIPID PANEL
Cholesterol: 140 mg/dL (ref 0–200)
HDL: 59.5 mg/dL (ref >39.00)
LDL Cholesterol: 69 mg/dL (ref 0–99)
NonHDL: 80.44
Total CHOL/HDL Ratio: 2
Triglycerides: 59 mg/dL (ref 0.0–149.0)
VLDL: 11.8 mg/dL (ref 0.0–40.0)

## 2022-07-19 LAB — COMPREHENSIVE METABOLIC PANEL
ALT: 16 U/L (ref 0–35)
AST: 23 U/L (ref 0–37)
Albumin: 4 g/dL (ref 3.5–5.2)
Alkaline Phosphatase: 45 U/L (ref 39–117)
BUN: 17 mg/dL (ref 6–23)
CO2: 29 mEq/L (ref 19–32)
Calcium: 9.5 mg/dL (ref 8.4–10.5)
Chloride: 102 mEq/L (ref 96–112)
Creatinine, Ser: 0.83 mg/dL (ref 0.40–1.20)
GFR: 70.51 mL/min (ref >60.00)
Glucose, Bld: 99 mg/dL (ref 70–99)
Potassium: 4.1 mEq/L (ref 3.5–5.1)
Sodium: 139 mEq/L (ref 135–145)
Total Bilirubin: 0.9 mg/dL (ref 0.2–1.2)
Total Protein: 7 g/dL (ref 6.0–8.3)

## 2022-07-19 LAB — CBC WITH DIFFERENTIAL/PLATELET
Basophils Absolute: 0 10*3/uL (ref 0.0–0.1)
Basophils Relative: 0.3 % (ref 0.0–3.0)
Eosinophils Absolute: 0.1 10*3/uL (ref 0.0–0.7)
Eosinophils Relative: 1.7 % (ref 0.0–5.0)
HCT: 41 % (ref 36.0–46.0)
Hemoglobin: 13.9 g/dL (ref 12.0–15.0)
Lymphocytes Relative: 10.7 % — ABNORMAL LOW (ref 12.0–46.0)
Lymphs Abs: 0.8 10*3/uL (ref 0.7–4.0)
MCHC: 33.9 g/dL (ref 30.0–36.0)
MCV: 96 fl (ref 78.0–100.0)
Monocytes Absolute: 0.5 10*3/uL (ref 0.1–1.0)
Monocytes Relative: 7.5 % (ref 3.0–12.0)
Neutro Abs: 5.7 10*3/uL (ref 1.4–7.7)
Neutrophils Relative %: 79.8 % — ABNORMAL HIGH (ref 43.0–77.0)
Platelets: 195 10*3/uL (ref 150.0–400.0)
RBC: 4.27 Mil/uL (ref 3.87–5.11)
RDW: 12.9 % (ref 11.5–15.5)
WBC: 7.1 10*3/uL (ref 4.0–10.5)

## 2022-07-19 LAB — TSH: TSH: 2.25 u[IU]/mL (ref 0.35–5.50)

## 2022-07-22 LAB — HEMOGLOBIN A1C: Hgb A1c MFr Bld: 5.9 % (ref 4.6–6.5)

## 2022-08-08 ENCOUNTER — Ambulatory Visit (INDEPENDENT_AMBULATORY_CARE_PROVIDER_SITE_OTHER): Payer: BC Managed Care – PPO

## 2022-08-08 DIAGNOSIS — I428 Other cardiomyopathies: Secondary | ICD-10-CM | POA: Diagnosis not present

## 2022-08-08 LAB — CUP PACEART REMOTE DEVICE CHECK
Battery Remaining Longevity: 134 mo
Battery Voltage: 3.15 V
Brady Statistic AP VP Percent: 19.67 %
Brady Statistic AP VS Percent: 0 %
Brady Statistic AS VP Percent: 80.13 %
Brady Statistic AS VS Percent: 0.2 %
Brady Statistic RA Percent Paced: 19.74 %
Brady Statistic RV Percent Paced: 99.8 %
Date Time Interrogation Session: 20231011204526
Implantable Lead Implant Date: 20100701
Implantable Lead Implant Date: 20100701
Implantable Lead Location: 753859
Implantable Lead Location: 753860
Implantable Lead Model: 5076
Implantable Lead Model: 5076
Implantable Pulse Generator Implant Date: 20230410
Lead Channel Impedance Value: 342 Ohm
Lead Channel Impedance Value: 418 Ohm
Lead Channel Impedance Value: 456 Ohm
Lead Channel Impedance Value: 532 Ohm
Lead Channel Pacing Threshold Amplitude: 0.5 V
Lead Channel Pacing Threshold Amplitude: 1 V
Lead Channel Pacing Threshold Pulse Width: 0.4 ms
Lead Channel Pacing Threshold Pulse Width: 0.4 ms
Lead Channel Sensing Intrinsic Amplitude: 0.75 mV
Lead Channel Sensing Intrinsic Amplitude: 0.75 mV
Lead Channel Setting Pacing Amplitude: 1.5 V
Lead Channel Setting Pacing Amplitude: 2.25 V
Lead Channel Setting Pacing Pulse Width: 0.4 ms
Lead Channel Setting Sensing Sensitivity: 4 mV

## 2022-08-14 ENCOUNTER — Ambulatory Visit (HOSPITAL_BASED_OUTPATIENT_CLINIC_OR_DEPARTMENT_OTHER)
Admission: RE | Admit: 2022-08-14 | Discharge: 2022-08-14 | Disposition: A | Discharge status: Home / Self Care | Payer: BC Managed Care – PPO | Source: Ambulatory Visit | Attending: Physician Assistant | Admitting: Physician Assistant

## 2022-08-14 DIAGNOSIS — M8580 Other specified disorders of bone density and structure, unspecified site: Secondary | ICD-10-CM | POA: Diagnosis present

## 2022-08-20 NOTE — Progress Notes (Signed)
Remote pacemaker transmission.   

## 2022-11-07 ENCOUNTER — Ambulatory Visit (INDEPENDENT_AMBULATORY_CARE_PROVIDER_SITE_OTHER): Payer: BC Managed Care – PPO

## 2022-11-07 DIAGNOSIS — I428 Other cardiomyopathies: Secondary | ICD-10-CM

## 2022-11-08 LAB — CUP PACEART REMOTE DEVICE CHECK
Battery Remaining Longevity: 141 mo
Battery Voltage: 3.11 V
Brady Statistic AP VP Percent: 15.79 %
Brady Statistic AP VS Percent: 0 %
Brady Statistic AS VP Percent: 83.99 %
Brady Statistic AS VS Percent: 0.22 %
Brady Statistic RA Percent Paced: 15.86 %
Brady Statistic RV Percent Paced: 99.78 %
Date Time Interrogation Session: 20240111173221
Implantable Lead Connection Status: 753985
Implantable Lead Connection Status: 753985
Implantable Lead Implant Date: 20100701
Implantable Lead Implant Date: 20100701
Implantable Lead Location: 753859
Implantable Lead Location: 753860
Implantable Lead Model: 5076
Implantable Lead Model: 5076
Implantable Pulse Generator Implant Date: 20230410
Lead Channel Impedance Value: 342 Ohm
Lead Channel Impedance Value: 418 Ohm
Lead Channel Impedance Value: 456 Ohm
Lead Channel Impedance Value: 551 Ohm
Lead Channel Pacing Threshold Amplitude: 0.5 V
Lead Channel Pacing Threshold Amplitude: 0.875 V
Lead Channel Pacing Threshold Pulse Width: 0.4 ms
Lead Channel Pacing Threshold Pulse Width: 0.4 ms
Lead Channel Sensing Intrinsic Amplitude: 0.5 mV
Lead Channel Sensing Intrinsic Amplitude: 0.5 mV
Lead Channel Setting Pacing Amplitude: 1.5 V
Lead Channel Setting Pacing Amplitude: 2 V
Lead Channel Setting Pacing Pulse Width: 0.4 ms
Lead Channel Setting Sensing Sensitivity: 4 mV
Zone Setting Status: 755011
Zone Setting Status: 755011

## 2022-11-27 NOTE — Progress Notes (Signed)
Remote pacemaker transmission.   

## 2023-01-04 ENCOUNTER — Other Ambulatory Visit: Payer: Self-pay | Admitting: Internal Medicine

## 2023-02-01 ENCOUNTER — Other Ambulatory Visit: Payer: Self-pay | Admitting: Internal Medicine

## 2023-02-03 NOTE — Telephone Encounter (Signed)
Rx refill sent to pharmacy. 

## 2023-02-06 ENCOUNTER — Ambulatory Visit (INDEPENDENT_AMBULATORY_CARE_PROVIDER_SITE_OTHER): Payer: BC Managed Care – PPO

## 2023-02-06 DIAGNOSIS — I428 Other cardiomyopathies: Secondary | ICD-10-CM

## 2023-02-06 LAB — CUP PACEART REMOTE DEVICE CHECK
Battery Remaining Longevity: 127 mo
Battery Voltage: 3.06 V
Brady Statistic AP VP Percent: 20.41 %
Brady Statistic AP VS Percent: 0 %
Brady Statistic AS VP Percent: 79.52 %
Brady Statistic AS VS Percent: 0.06 %
Brady Statistic RA Percent Paced: 20.42 %
Brady Statistic RV Percent Paced: 99.94 %
Date Time Interrogation Session: 20240410202432
Implantable Lead Connection Status: 753985
Implantable Lead Connection Status: 753985
Implantable Lead Implant Date: 20100701
Implantable Lead Implant Date: 20100701
Implantable Lead Location: 753859
Implantable Lead Location: 753860
Implantable Lead Model: 5076
Implantable Lead Model: 5076
Implantable Pulse Generator Implant Date: 20230410
Lead Channel Impedance Value: 323 Ohm
Lead Channel Impedance Value: 399 Ohm
Lead Channel Impedance Value: 456 Ohm
Lead Channel Impedance Value: 532 Ohm
Lead Channel Pacing Threshold Amplitude: 0.5 V
Lead Channel Pacing Threshold Amplitude: 1.125 V
Lead Channel Pacing Threshold Pulse Width: 0.4 ms
Lead Channel Pacing Threshold Pulse Width: 0.4 ms
Lead Channel Sensing Intrinsic Amplitude: 0.625 mV
Lead Channel Sensing Intrinsic Amplitude: 0.625 mV
Lead Channel Setting Pacing Amplitude: 1.5 V
Lead Channel Setting Pacing Amplitude: 2.25 V
Lead Channel Setting Pacing Pulse Width: 0.4 ms
Lead Channel Setting Sensing Sensitivity: 4 mV
Zone Setting Status: 755011
Zone Setting Status: 755011

## 2023-03-14 NOTE — Progress Notes (Signed)
Remote pacemaker transmission.   

## 2023-05-08 ENCOUNTER — Ambulatory Visit (INDEPENDENT_AMBULATORY_CARE_PROVIDER_SITE_OTHER): Payer: BC Managed Care – PPO

## 2023-05-08 DIAGNOSIS — I428 Other cardiomyopathies: Secondary | ICD-10-CM

## 2023-05-08 LAB — CUP PACEART REMOTE DEVICE CHECK
Battery Remaining Longevity: 124 mo
Battery Voltage: 3.04 V
Brady Statistic AP VP Percent: 19.22 %
Brady Statistic AP VS Percent: 0 %
Brady Statistic AS VP Percent: 80.76 %
Brady Statistic AS VS Percent: 0.03 %
Brady Statistic RA Percent Paced: 19.2 %
Brady Statistic RV Percent Paced: 99.97 %
Date Time Interrogation Session: 20240710193152
Implantable Lead Connection Status: 753985
Implantable Lead Connection Status: 753985
Implantable Lead Implant Date: 20100701
Implantable Lead Implant Date: 20100701
Implantable Lead Location: 753859
Implantable Lead Location: 753860
Implantable Lead Model: 5076
Implantable Lead Model: 5076
Implantable Pulse Generator Implant Date: 20230410
Lead Channel Impedance Value: 342 Ohm
Lead Channel Impedance Value: 399 Ohm
Lead Channel Impedance Value: 456 Ohm
Lead Channel Impedance Value: 532 Ohm
Lead Channel Pacing Threshold Amplitude: 0.5 V
Lead Channel Pacing Threshold Amplitude: 1.125 V
Lead Channel Pacing Threshold Pulse Width: 0.4 ms
Lead Channel Pacing Threshold Pulse Width: 0.4 ms
Lead Channel Sensing Intrinsic Amplitude: 0.625 mV
Lead Channel Sensing Intrinsic Amplitude: 0.625 mV
Lead Channel Setting Pacing Amplitude: 1.5 V
Lead Channel Setting Pacing Amplitude: 2.25 V
Lead Channel Setting Pacing Pulse Width: 0.4 ms
Lead Channel Setting Sensing Sensitivity: 4 mV
Zone Setting Status: 755011
Zone Setting Status: 755011

## 2023-05-09 LAB — HM MAMMOGRAPHY

## 2023-05-13 ENCOUNTER — Encounter: Payer: Self-pay | Admitting: Physician Assistant

## 2023-05-26 NOTE — Progress Notes (Signed)
Remote pacemaker transmission.   

## 2023-06-12 ENCOUNTER — Encounter (INDEPENDENT_AMBULATORY_CARE_PROVIDER_SITE_OTHER): Payer: Self-pay

## 2023-06-29 ENCOUNTER — Other Ambulatory Visit: Payer: Self-pay | Admitting: Internal Medicine

## 2023-07-22 ENCOUNTER — Encounter: Payer: Self-pay | Admitting: Physician Assistant

## 2023-07-22 ENCOUNTER — Ambulatory Visit (INDEPENDENT_AMBULATORY_CARE_PROVIDER_SITE_OTHER): Payer: BC Managed Care – PPO | Admitting: Physician Assistant

## 2023-07-22 VITALS — BP 102/60 | HR 73 | Temp 97.7°F | Ht 64.0 in | Wt 114.4 lb

## 2023-07-22 DIAGNOSIS — M8580 Other specified disorders of bone density and structure, unspecified site: Secondary | ICD-10-CM | POA: Diagnosis not present

## 2023-07-22 DIAGNOSIS — Z0000 Encounter for general adult medical examination without abnormal findings: Secondary | ICD-10-CM | POA: Diagnosis not present

## 2023-07-22 LAB — COMPREHENSIVE METABOLIC PANEL
ALT: 13 U/L (ref 0–35)
AST: 19 U/L (ref 0–37)
Albumin: 4 g/dL (ref 3.5–5.2)
Alkaline Phosphatase: 39 U/L (ref 39–117)
BUN: 24 mg/dL — ABNORMAL HIGH (ref 6–23)
CO2: 30 mEq/L (ref 19–32)
Calcium: 9.4 mg/dL (ref 8.4–10.5)
Chloride: 105 mEq/L (ref 96–112)
Creatinine, Ser: 0.8 mg/dL (ref 0.40–1.20)
GFR: 73.17 mL/min (ref >60.00)
Glucose, Bld: 98 mg/dL (ref 70–99)
Potassium: 3.7 mEq/L (ref 3.5–5.1)
Sodium: 141 mEq/L (ref 135–145)
Total Bilirubin: 0.6 mg/dL (ref 0.2–1.2)
Total Protein: 6.8 g/dL (ref 6.0–8.3)

## 2023-07-22 LAB — CBC WITH DIFFERENTIAL/PLATELET
Basophils Absolute: 0 10*3/uL (ref 0.0–0.1)
Basophils Relative: 0.7 % (ref 0.0–3.0)
Eosinophils Absolute: 0.2 10*3/uL (ref 0.0–0.7)
Eosinophils Relative: 4.5 % (ref 0.0–5.0)
HCT: 40.3 % (ref 36.0–46.0)
Hemoglobin: 13.2 g/dL (ref 12.0–15.0)
Lymphocytes Relative: 34.6 % (ref 12.0–46.0)
Lymphs Abs: 1.9 10*3/uL (ref 0.7–4.0)
MCHC: 32.8 g/dL (ref 30.0–36.0)
MCV: 96.4 fl (ref 78.0–100.0)
Monocytes Absolute: 0.4 10*3/uL (ref 0.1–1.0)
Monocytes Relative: 8 % (ref 3.0–12.0)
Neutro Abs: 2.9 10*3/uL (ref 1.4–7.7)
Neutrophils Relative %: 52.2 % (ref 43.0–77.0)
Platelets: 278 10*3/uL (ref 150.0–400.0)
RBC: 4.18 Mil/uL (ref 3.87–5.11)
RDW: 13.3 % (ref 11.5–15.5)
WBC: 5.5 10*3/uL (ref 4.0–10.5)

## 2023-07-22 LAB — LIPID PANEL
Cholesterol: 204 mg/dL — ABNORMAL HIGH (ref 0–200)
HDL: 60.9 mg/dL (ref >39.00)
LDL Cholesterol: 130 mg/dL — ABNORMAL HIGH (ref 0–99)
NonHDL: 143.5
Total CHOL/HDL Ratio: 3
Triglycerides: 68 mg/dL (ref 0.0–149.0)
VLDL: 13.6 mg/dL (ref 0.0–40.0)

## 2023-07-22 LAB — TSH: TSH: 2.63 u[IU]/mL (ref 0.35–5.50)

## 2023-07-22 LAB — HEMOGLOBIN A1C: Hgb A1c MFr Bld: 5.8 % (ref 4.6–6.5)

## 2023-07-22 NOTE — Progress Notes (Signed)
Subjective:    Patient ID: Melissa [name], female    DOB: July 30, 1951, 72 y.o.   MRN: [id number]  Chief Complaint  Patient presents with   Annual Exam    Pt in theoffice for annual CPE; pt is fasting for labs; pt brought documentation from recent Covid and Flu vaccines; pt not taking Lipitor due to running out and didn't know if she needed it; pt due for Colonoscopy since 2017;     HPI 72 y.o. patient presents today for annual exam. Married. 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   Current Care Team: Dr. 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   DUB (dysfunctional uterine bleeding)    Fibroid    Hypertension    Nonischemic cardiomyopathy (HCC)    EF 35% 2013   Pacemaker  Medtronic    medtronic- placed 2010   Syncope    heart rate < normal, passed out     Past Surgical History:  Procedure Laterality Date   COLONOSCOPY  2007   Normal    INSERT / REPLACE / REMOVE PACEMAKER     ORIF ELBOW FRACTURE  03/17/2012   Procedure: OPEN REDUCTION INTERNAL FIXATION (ORIF) ELBOW/OLECRANON FRACTURE;  Surgeon: Dominica Severin, MD;  [facility];  Service: Orthopedics;  Laterality: Right;  RIGHT ELBOW ORIF WITH LIGAMENT REPAIR, RECONSTRUCTION   PACEMAKER INSERTION  2010   PPM GENERATOR CHANGEOUT N/A 02/04/2022   Procedure: PPM GENERATOR CHANGEOUT;  Surgeon: Duke Salvia, MD;  Location: [facility];  Service: Cardiovascular;  Laterality: N/A;   VAGINAL HYSTERECTOMY  07-06-01    Family History  Problem Relation Age of Onset    Hypertension Mother    Cancer Father        SKIN CANCER   Diabetes Paternal Aunt        GREAT PAT AUNT   Anesthesia problems Neg Hx     Social History   Tobacco Use   Smoking status: Never   Smokeless tobacco: Never  Vaping Use   Vaping status: Never Used  Substance Use Topics   Alcohol use: No   Drug use: No     Allergies  Allergen Reactions   Prednisone     GI distress    Review of Systems NEGATIVE UNLESS OTHERWISE INDICATED IN HPI      Objective:     BP 102/60 (BP Location: Left Arm)   Pulse 73   Temp 97.7 F (36.5 C) (Temporal)   Ht 5\' 4"  (1.626 m)   Wt 114 lb 6.4 oz (51.9 kg)   SpO2 95%   BMI 19.64 kg/m   Wt Readings from Last 3 Encounters:  07/22/23 114 lb 6.4 oz (51.9 kg)  07/16/22 115 lb (52.2 kg)  06/13/22 114 lb 3.2 oz (51.8 kg)    BP Readings from Last 3 Encounters:  07/22/23 102/60  07/16/22 131/74  06/13/22 110/74     Physical Exam Vitals and nursing note reviewed.  Constitutional:      General: She is not in acute distress.    Appearance: Normal appearance. She  is normal weight. She is not ill-appearing or toxic-appearing.  HENT:     Head: Normocephalic and atraumatic.     Right Ear: Tympanic membrane, ear canal and external ear normal.     Left Ear: Tympanic membrane, ear canal and external ear normal.     Nose: Nose normal.     Mouth/Throat:     Mouth: Mucous membranes are moist.  Eyes:     Extraocular Movements: Extraocular movements intact.     Conjunctiva/sclera: Conjunctivae normal.     Pupils: Pupils are equal, round, and reactive to light.     Comments: Wears glasses  Cardiovascular:     Rate and Rhythm: Normal rate and regular rhythm.     Pulses: Normal pulses.     Heart sounds: Normal heart sounds.  Pulmonary:     Effort: Pulmonary effort is normal.     Breath sounds: Normal breath sounds.  Abdominal:     General: Abdomen is flat. Bowel sounds are normal.     Palpations: Abdomen is soft. There is no mass.      Tenderness: There is no right CVA tenderness, left CVA tenderness or guarding.  Musculoskeletal:        General: Normal range of motion.     Cervical back: Normal range of motion and neck supple.     Right lower leg: No edema.     Left lower leg: No edema.     Comments: Significant scoliosis of spine noted  Skin:    General: Skin is warm and dry.     Findings: No rash.  Neurological:     General: No focal deficit present.     Mental Status: She is alert and oriented to person, place, and time.  Psychiatric:        Mood and Affect: Mood normal.        Behavior: Behavior normal.        Thought Content: Thought content normal.        Judgment: Judgment normal.        Assessment & Plan:  Encounter for annual physical exam -     CBC with Differential/Platelet -     Comprehensive metabolic panel -     Hemoglobin A1c -     Lipid panel -     TSH  Osteopenia, unspecified location   Very pleasant and overall healthy 72 yo patient. Encouraged her to keep up good work with health maintenance and caring for herself. Age-appropriate screening and counseling performed today. Will check labs and call with results. Preventive measures discussed and printed in AVS for patient. -Water aerobics advised -Regular dental /vision exams -She knows to call if any concerns     Return in about 1 year (around 07/21/2024) for physical.   Paxten Appelt M Krish Bailly, PA-C

## 2023-08-04 ENCOUNTER — Encounter: Payer: Self-pay | Admitting: Internal Medicine

## 2023-08-04 ENCOUNTER — Ambulatory Visit: Payer: BC Managed Care – PPO | Attending: Internal Medicine | Admitting: Internal Medicine

## 2023-08-04 VITALS — BP 112/67 | HR 69 | Ht 64.0 in | Wt 115.0 lb

## 2023-08-04 DIAGNOSIS — Z950 Presence of cardiac pacemaker: Secondary | ICD-10-CM

## 2023-08-04 DIAGNOSIS — I442 Atrioventricular block, complete: Secondary | ICD-10-CM

## 2023-08-04 DIAGNOSIS — I428 Other cardiomyopathies: Secondary | ICD-10-CM

## 2023-08-04 MED ORDER — CARVEDILOL 12.5 MG PO TABS: 12.5000 mg | ORAL_TABLET | Freq: Two times a day (BID) | ORAL | 1 refills | Status: DC

## 2023-08-04 MED ORDER — RAMIPRIL 5 MG PO CAPS: ORAL_CAPSULE | ORAL | 3 refills | Status: DC

## 2023-08-04 MED ORDER — ATORVASTATIN CALCIUM 20 MG PO TABS: 20.0000 mg | ORAL_TABLET | Freq: Every day | ORAL | 3 refills | Status: DC

## 2023-08-04 NOTE — Patient Instructions (Signed)
Medication Instructions:  Your physician recommends that you continue on your current medications as directed. Please refer to the Current Medication list given to you today.  *If you need a refill on your cardiac medications before your next appointment, please call your pharmacy*   Lab Work: None ordered.  If you have labs (blood work) drawn today and your tests are completely normal, you will receive your results only by: MyChart Message (if you have MyChart) OR A paper copy in the mail If you have any lab test that is abnormal or we need to change your treatment, we will call you to review the results.   Testing/Procedures: None ordered.    Follow-Up: At  [facility], you and your health needs are our priority.  As part of our continuing mission to provide you with exceptional heart care, we have created designated Provider Care Teams.  These Care Teams include your primary Cardiologist (physician) and Advanced Practice Providers (APPs -  Physician Assistants and Nurse Practitioners) who all work together to provide you with the care you need, when you need it.  We recommend signing up for the patient portal called "MyChart".  Sign up information is provided on this After Visit Summary.  MyChart is used to connect with patients for Virtual Visits (Telemedicine).  Patients are able to view lab/test results, encounter notes, upcoming appointments, etc.  Non-urgent messages can be sent to your provider as well.   To learn more about what you can do with MyChart, go to [email or web address].    Your next appointment:   9 months with Dr Klein 

## 2023-08-04 NOTE — Progress Notes (Unsigned)
Patient Care Team: Allwardt, Crist Infante, PA-C as PCP - General (Physician Assistant)   HPI  Melissa [name] is a 72 y.o. female is seen in followup for a Medtronic  pacemaker implanted 2010 for complete heart block in setting of mild nonischemic cardiomyopathy, ejection fraction 35-40%. Cardiac catheterization demonstrated nonobstructive coronary disease.  The patient denies chest pain, shortness of breath, nocturnal dyspnea, orthopnea or peripheral edema.  There have been no palpitations, lightheadedness or syncope.    Started on a statin, stopped it because it ran out.  LDL 130 off and 69 on.  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   DUB (dysfunctional uterine bleeding)    Fibroid    Hypertension    Nonischemic cardiomyopathy (HCC)    EF 35% 2013   Pacemaker  Medtronic    medtronic- placed 2010   Syncope    heart rate < normal, passed out     Past Surgical History:  Procedure Laterality Date   COLONOSCOPY  2007   Normal    INSERT / REPLACE / REMOVE PACEMAKER     ORIF ELBOW FRACTURE  03/17/2012   Procedure: OPEN REDUCTION INTERNAL FIXATION (ORIF) ELBOW/OLECRANON FRACTURE;  Surgeon: Dominica Severin, MD;  Location[facility];  Service: Orthopedics;  Laterality: Right;  RIGHT ELBOW ORIF WITH LIGAMENT REPAIR, RECONSTRUCTION   PACEMAKER INSERTION  2010   PPM GENERATOR CHANGEOUT N/A 02/04/2022   Procedure: PPM GENERATOR CHANGEOUT;  Surgeon: Duke Salvia, MD;  Location: [facility];  Service: Cardiovascular;  Laterality: N/A;   VAGINAL HYSTERECTOMY  07-06-01    Current Outpatient Medications  Medication Sig Dispense  Refill   acetaminophen (TYLENOL) 325 MG tablet Take 325 mg by mouth every 6 (six) hours as needed for headache.     Ascorbic Acid (VITAMIN C PO) Take 1 tablet by mouth daily.     atorvastatin (LIPITOR) 20 MG tablet Take 20 mg by mouth daily.     Calcium Carb-Cholecalciferol 600-20 MG-MCG TABS Take 1 tablet by mouth 2 (two) times daily.     carvedilol (COREG) 12.5 MG tablet Take 1 tablet (12.5 mg total) by mouth 2 (two) times daily with a meal. 180 tablet 1   ramipril (ALTACE) 5 MG capsule TAKE 1 CAPSULE BY MOUTH EVERY DAY 90 capsule 0   No current facility-administered medications for this visit.    Allergies  Allergen Reactions   Prednisone     GI distress    Review of Systems negative except from HPI and PMH  Physical Exam BP 112/67   Pulse 69   Ht 5\' 4"  (1.626 m)   Wt 115 lb (52.2 kg)   SpO2 96%   BMI 19.74 kg/m  Well developed and well nourished in no acute distress HENT normal Neck supple   Clear Device pocket well healed; without hematoma or erythema.  There is no tethering  Regular rate and rhythm, no  murmur Abd-soft  with active BS No Clubbing cyanosis  edema Skin-warm and dry A & Oriented  Grossly normal sensory and motor function  ECG sinus P-synchronous/ AV  pacing  at 69 Normal 20/15/45  Device function is normal. Programming changes none  See Paceart for details      Assessment and  Plan  Nonischemic cardiomyopathy   Complete heart block  Pacemaker-dual-chamber-Medtronic    CHF class 2  Hyperlipidemia     Functional status quite good.  Will continue the ramipril and carvedilol.  With aortic atherosclerosis, think it is reasonable to maintain a statin, so we will refill at 20 mg daily.

## 2023-08-05 LAB — CUP PACEART INCLINIC DEVICE CHECK
Battery Remaining Longevity: 123 mo
Battery Voltage: 3.03 V
Brady Statistic AP VP Percent: 19.32 %
Brady Statistic AP VS Percent: 0 %
Brady Statistic AS VP Percent: 80.57 %
Brady Statistic AS VS Percent: 0.11 %
Brady Statistic RA Percent Paced: 19.34 %
Brady Statistic RV Percent Paced: 99.89 %
Date Time Interrogation Session: 20241007155000
Implantable Lead Connection Status: 753985
Implantable Lead Connection Status: 753985
Implantable Lead Implant Date: 20100701
Implantable Lead Implant Date: 20100701
Implantable Lead Location: 753859
Implantable Lead Location: 753860
Implantable Lead Model: 5076
Implantable Lead Model: 5076
Implantable Pulse Generator Implant Date: 20230410
Lead Channel Impedance Value: 380 Ohm
Lead Channel Impedance Value: 437 Ohm
Lead Channel Impedance Value: 494 Ohm
Lead Channel Impedance Value: 570 Ohm
Lead Channel Pacing Threshold Amplitude: 0.5 V
Lead Channel Pacing Threshold Amplitude: 1 V
Lead Channel Pacing Threshold Pulse Width: 0.4 ms
Lead Channel Pacing Threshold Pulse Width: 0.4 ms
Lead Channel Sensing Intrinsic Amplitude: 0.625 mV
Lead Channel Sensing Intrinsic Amplitude: 0.75 mV
Lead Channel Setting Pacing Amplitude: 1.5 V
Lead Channel Setting Pacing Amplitude: 2.25 V
Lead Channel Setting Pacing Pulse Width: 0.4 ms
Lead Channel Setting Sensing Sensitivity: 4 mV
Zone Setting Status: 755011
Zone Setting Status: 755011

## 2023-08-07 ENCOUNTER — Ambulatory Visit (INDEPENDENT_AMBULATORY_CARE_PROVIDER_SITE_OTHER): Payer: BC Managed Care – PPO

## 2023-08-07 DIAGNOSIS — I428 Other cardiomyopathies: Secondary | ICD-10-CM

## 2023-08-07 LAB — CUP PACEART REMOTE DEVICE CHECK
Battery Remaining Longevity: 121 mo
Battery Voltage: 3.03 V
Brady Statistic AP VP Percent: 14.9 %
Brady Statistic AP VS Percent: 0 %
Brady Statistic AS VP Percent: 85.09 %
Brady Statistic AS VS Percent: 0.01 %
Brady Statistic RA Percent Paced: 14.89 %
Brady Statistic RV Percent Paced: 99.99 %
Date Time Interrogation Session: 20241010042301
Implantable Lead Connection Status: 753985
Implantable Lead Connection Status: 753985
Implantable Lead Implant Date: 20100701
Implantable Lead Implant Date: 20100701
Implantable Lead Location: 753859
Implantable Lead Location: 753860
Implantable Lead Model: 5076
Implantable Lead Model: 5076
Implantable Pulse Generator Implant Date: 20230410
Lead Channel Impedance Value: 323 Ohm
Lead Channel Impedance Value: 399 Ohm
Lead Channel Impedance Value: 437 Ohm
Lead Channel Impedance Value: 532 Ohm
Lead Channel Pacing Threshold Amplitude: 0.5 V
Lead Channel Pacing Threshold Amplitude: 1.125 V
Lead Channel Pacing Threshold Pulse Width: 0.4 ms
Lead Channel Pacing Threshold Pulse Width: 0.4 ms
Lead Channel Sensing Intrinsic Amplitude: 0.375 mV
Lead Channel Sensing Intrinsic Amplitude: 0.375 mV
Lead Channel Setting Pacing Amplitude: 1.5 V
Lead Channel Setting Pacing Amplitude: 2.25 V
Lead Channel Setting Pacing Pulse Width: 0.4 ms
Lead Channel Setting Sensing Sensitivity: 4 mV
Zone Setting Status: 755011
Zone Setting Status: 755011

## 2023-08-19 NOTE — Progress Notes (Signed)
Remote pacemaker transmission.   

## 2023-11-06 ENCOUNTER — Ambulatory Visit (INDEPENDENT_AMBULATORY_CARE_PROVIDER_SITE_OTHER): Payer: BC Managed Care – PPO

## 2023-11-06 DIAGNOSIS — I428 Other cardiomyopathies: Secondary | ICD-10-CM

## 2023-11-06 LAB — CUP PACEART REMOTE DEVICE CHECK
Battery Remaining Longevity: 113 mo
Battery Voltage: 3.02 V
Brady Statistic AP VP Percent: 17.72 %
Brady Statistic AP VS Percent: 0 %
Brady Statistic AS VP Percent: 82.26 %
Brady Statistic AS VS Percent: 0.02 %
Brady Statistic RA Percent Paced: 17.7 %
Brady Statistic RV Percent Paced: 99.98 %
Date Time Interrogation Session: 20250109041228
Implantable Lead Connection Status: 753985
Implantable Lead Connection Status: 753985
Implantable Lead Implant Date: 20100701
Implantable Lead Implant Date: 20100701
Implantable Lead Location: 753859
Implantable Lead Location: 753860
Implantable Lead Model: 5076
Implantable Lead Model: 5076
Implantable Pulse Generator Implant Date: 20230410
Lead Channel Impedance Value: 323 Ohm
Lead Channel Impedance Value: 399 Ohm
Lead Channel Impedance Value: 456 Ohm
Lead Channel Impedance Value: 532 Ohm
Lead Channel Pacing Threshold Amplitude: 0.5 V
Lead Channel Pacing Threshold Amplitude: 1.125 V
Lead Channel Pacing Threshold Pulse Width: 0.4 ms
Lead Channel Pacing Threshold Pulse Width: 0.4 ms
Lead Channel Sensing Intrinsic Amplitude: 0.25 mV
Lead Channel Sensing Intrinsic Amplitude: 0.25 mV
Lead Channel Setting Pacing Amplitude: 1.5 V
Lead Channel Setting Pacing Amplitude: 2.5 V
Lead Channel Setting Pacing Pulse Width: 0.4 ms
Lead Channel Setting Sensing Sensitivity: 4 mV
Zone Setting Status: 755011
Zone Setting Status: 755011

## 2023-11-21 ENCOUNTER — Telehealth: Payer: Self-pay

## 2023-11-21 NOTE — Telephone Encounter (Signed)
Copied from CRM [id number]. Topic: Appointments - Appointment Scheduling >> Nov 21, 2023  8:22 AM Kathryne Eriksson wrote: Patient/patient representative is calling to schedule an appointment. Refer to attachments for appointment information.   Please see note above and call pt to schedule appt, since E2C2 didn't schedule the patient. Thanks!

## 2023-11-24 NOTE — Telephone Encounter (Signed)
Patient last Colonoscopy was 03/03/06 and was overdue since 2017, please advise pt and schedule appt if wanting to discuss

## 2023-12-04 ENCOUNTER — Telehealth: Payer: Self-pay | Admitting: Physician Assistant

## 2023-12-04 ENCOUNTER — Encounter: Payer: Self-pay | Admitting: Physician Assistant

## 2023-12-04 DIAGNOSIS — Z1211 Encounter for screening for malignant neoplasm of colon: Secondary | ICD-10-CM

## 2023-12-04 DIAGNOSIS — Z7189 Other specified counseling: Secondary | ICD-10-CM

## 2023-12-04 NOTE — Progress Notes (Signed)
   Virtual Visit via Video Note  I connected with  Melissa [name]  on 12/04/23 at 11:00 AM EST by a video enabled telemedicine application and verified that I am speaking with the correct person using two identifiers.  Location: Patient: work in [employer] Provider: Nature Conservation Officer at [employer] Persons present: Patient and myself   I discussed the limitations of evaluation and management by telemedicine and the availability of in person appointments. The patient expressed understanding and agreed to proceed.   History of Present Illness:    History of Present Illness   The patient, 73 yo female, requests a referral for a colonoscopy. She reports no current gastrointestinal symptoms, including changes in bowel movements, blood in the stool, or weight changes. She has had a colonoscopy in the past, approximately 10-12 years ago, with no abnormal findings (no records available). The patient prefers a colonoscopy over other screening methods, such as Cologuard, for reassurance.        Observations/Objective:   Gen: Awake, alert, no acute distress Resp: Breathing is even and non-labored Psych: calm/pleasant demeanor Neuro: Alert and Oriented x 3, + facial symmetry, speech is clear.   Assessment and Plan:  Assessment and Plan    Colon Cancer Screening Last colonoscopy on file here is from 2007 with no abnormal findings. No current bowel symptoms or weight changes. Patient prefers colonoscopy over Cologuard for screening. -Refer to gastroenterology for colonoscopy. -If colonoscopy is clear, no further colonoscopies will be needed.  General Health Maintenance Up to date with all other screenings and check-ups. -Continue current health maintenance regimen. -If no contact from gastroenterology within two weeks, patient to reach out to our office.        Follow Up Instructions:    I discussed the assessment and treatment plan with the patient. The patient was provided  an opportunity to ask questions and all were answered. The patient agreed with the plan and demonstrated an understanding of the instructions.   The patient was advised to call back or seek an in-person evaluation if the symptoms worsen or if the condition fails to improve as anticipated.  Jarious Lyon M Dovber Ernest, PA-C

## 2023-12-17 NOTE — Progress Notes (Signed)
 Remote pacemaker transmission.

## 2023-12-24 ENCOUNTER — Encounter: Payer: Self-pay | Admitting: Internal Medicine

## 2024-02-02 ENCOUNTER — Other Ambulatory Visit: Payer: Self-pay | Admitting: Internal Medicine

## 2024-02-05 ENCOUNTER — Ambulatory Visit (INDEPENDENT_AMBULATORY_CARE_PROVIDER_SITE_OTHER): Payer: BC Managed Care – PPO

## 2024-02-05 DIAGNOSIS — I428 Other cardiomyopathies: Secondary | ICD-10-CM

## 2024-02-05 LAB — CUP PACEART REMOTE DEVICE CHECK
Battery Remaining Longevity: 113 mo
Battery Voltage: 3.02 V
Brady Statistic AP VP Percent: 17.08 %
Brady Statistic AP VS Percent: 0 %
Brady Statistic AS VP Percent: 82.89 %
Brady Statistic AS VS Percent: 0.02 %
Brady Statistic RA Percent Paced: 17.07 %
Brady Statistic RV Percent Paced: 99.98 %
Date Time Interrogation Session: 20250410045953
Implantable Lead Connection Status: 753985
Implantable Lead Connection Status: 753985
Implantable Lead Implant Date: 20100701
Implantable Lead Implant Date: 20100701
Implantable Lead Location: 753859
Implantable Lead Location: 753860
Implantable Lead Model: 5076
Implantable Lead Model: 5076
Implantable Pulse Generator Implant Date: 20230410
Lead Channel Impedance Value: 323 Ohm
Lead Channel Impedance Value: 380 Ohm
Lead Channel Impedance Value: 456 Ohm
Lead Channel Impedance Value: 551 Ohm
Lead Channel Pacing Threshold Amplitude: 0.5 V
Lead Channel Pacing Threshold Amplitude: 1.125 V
Lead Channel Pacing Threshold Pulse Width: 0.4 ms
Lead Channel Pacing Threshold Pulse Width: 0.4 ms
Lead Channel Sensing Intrinsic Amplitude: 0.75 mV
Lead Channel Sensing Intrinsic Amplitude: 0.75 mV
Lead Channel Setting Pacing Amplitude: 1.5 V
Lead Channel Setting Pacing Amplitude: 2.25 V
Lead Channel Setting Pacing Pulse Width: 0.4 ms
Lead Channel Setting Sensing Sensitivity: 4 mV
Zone Setting Status: 755011
Zone Setting Status: 755011

## 2024-02-16 ENCOUNTER — Encounter: Payer: Self-pay | Admitting: Family

## 2024-02-16 ENCOUNTER — Ambulatory Visit: Admitting: Family

## 2024-02-16 ENCOUNTER — Ambulatory Visit: Payer: Self-pay | Admitting: *Deleted

## 2024-02-16 VITALS — BP 118/72 | HR 68 | Temp 97.3°F | Ht 64.0 in | Wt 111.8 lb

## 2024-02-16 DIAGNOSIS — S80212A Abrasion, left knee, initial encounter: Secondary | ICD-10-CM | POA: Diagnosis not present

## 2024-02-16 DIAGNOSIS — S80211A Abrasion, right knee, initial encounter: Secondary | ICD-10-CM

## 2024-02-16 NOTE — Telephone Encounter (Signed)
  Chief Complaint: fall- knees scraped Symptoms: knees scraped, headache- lasted few minutes  Frequency: today Pertinent Negatives: Patient denies headache now, swelling or joint pain Disposition: [] ED /[] Urgent Care (no appt availability in office) / [] Appointment(In office/virtual)/ []  [facility]/ [x] Home Care/ [] Refused Recommended Disposition /[] [facility]/ []  Follow-up with PCP Additional Notes: Patient states she took a fall on concrete- landed on hands and knees - she scraped her knees- and had headache- from stress- but that has gone away and reports no other injury. Patient advise home care for wounds, she assure she did not hit her head and the headache is gone- probable stress. Patient advised treat wounds- call back with any changes.  Copied from CRM [id number]. Topic: Clinical - Red Word Triage >> Feb 16, 2024  8:39 AM Melissa [name] J wrote: Red Word that prompted transfer to Nurse Triage: Pt slipped on cement at work, knees are scrapped, headache Reason for Disposition  Small cut (scratch) or abrasion (scrape) is also present  Answer Assessment - Initial Assessment Questions 1. MECHANISM: "How did the fall happen?"     Tripped on uneven sidewalk 2. DOMESTIC VIOLENCE AND ELDER ABUSE SCREENING: "Did you fall because someone pushed you or tried to hurt you?" If Yes, ask: "Are you safe now?"     no 3. ONSET: "When did the fall happen?" (e.g., minutes, hours, or days ago)     Today- 1/2 hours ago 4. LOCATION: "What part of the body hit the ground?" (e.g., back, buttocks, head, hips, knees, hands, head, stomach)     Bilateral knees scrapes, hands caught her fall, headache 5. INJURY: "Did you hurt (injure) yourself when you fell?" If Yes, ask: "What did you injure? Tell me more about this?" (e.g., body area; type of injury; pain severity)"     Scarped knees 6. PAIN: "Is there any pain?" If Yes, ask: "How bad is the pain?" (e.g., Scale 1-10; or mild,  moderate,  severe)   - NONE (0): No pain   - MILD (1-3): Doesn't interfere with normal activities    - MODERATE (4-7): Interferes with normal activities or awakens from sleep    - SEVERE (8-10): Excruciating pain, unable to do any normal activities      No pain- scraped on both knees 7. SIZE: For cuts, bruises, or swelling, ask: "How large is it?" (e.g., inches or centimeters)      Scrapes only  9. OTHER SYMPTOMS: "Do you have any other symptoms?" (e.g., dizziness, fever, weakness; new onset or worsening).      headache 10. CAUSE: "What do you think caused the fall (or falling)?" (e.g., tripped, dizzy spell)       trip  Protocols used: Falls and [facility]

## 2024-02-16 NOTE — Telephone Encounter (Signed)
 Noted.

## 2024-02-16 NOTE — Progress Notes (Signed)
 Patient ID: Melissa [name], female    DOB: May 31, 1951, 73 y.o.   MRN: [id number]  Chief Complaint  Patient presents with   Fall    Pt states she was walking inside of a building with concrete steps and fell down steps and scrapped bilateral knee.   Discussed the use of AI scribe software for clinical note transcription with the patient, who gave verbal consent to proceed.  History of Present Illness The patient, with an unspecified past medical history, presents after a fall earlier in the day. She was ascending concrete stairs outside of [place] courthouse when she tripped over an object on the stairs. She attempted to catch herself, but ended up falling forward onto the concrete. She sustained abrasions to both knees, with the right knee being more painful than the left. She also noted that her stockings were torn and her dress was stained with blood. She was able to get up and enter the courthouse, where she cleaned the wounds as best she could and applied bandages. She reports no other injuries from the fall.  Assessment & Plan Laceration and contusion of bilateral knees - Laceration and contusion from fall on cement steps. No concern for fracture, pt has good ROM without severe pain, tenderness at wound site. Concern for infection due to improper initial wound cleaning. Emphasized proper wound care to prevent infection. - Cleaned wounds well with hydrogen peroxide and Betadine . - Applied large adhesive bandaids, keep on until tomorrow. - Remove dressing during shower, clean with soap and water, dry thoroughly. - Use bacitracin  ointment (sample provided) if wound appears moist and keep covered until scab forms. - Monitor for infection signs: increased pain, red streaks, purulent drainage. Seek medical attention if these occur. - Avoid stairs, use elevator until pain subsides, about 2-3 days. - Provide workplace note to excuse to return tomorrow.       Subjective:    Outpatient Medications  Prior to Visit  Medication Sig Dispense Refill   acetaminophen  (TYLENOL ) 325 MG tablet Take 325 mg by mouth every 6 (six) hours as needed for headache.     Ascorbic Acid (VITAMIN C PO) Take 1 tablet by mouth daily.     atorvastatin  (LIPITOR) 20 MG tablet Take 1 tablet (20 mg total) by mouth daily. 90 tablet 3   Calcium  Carb-Cholecalciferol 600-20 MG-MCG TABS Take 1 tablet by mouth 2 (two) times daily.     carvedilol  (COREG ) 12.5 MG tablet TAKE 1 TABLET (12.5MG  TOTAL) BY MOUTH TWICE A DAY WITH MEALS 180 tablet 1   ramipril  (ALTACE ) 5 MG capsule TAKE 1 CAPSULE BY MOUTH EVERY DAY 90 capsule 3   No facility-administered medications prior to visit.   Past Medical History:  Diagnosis Date   Anxiety    Complete heart block (HCC)    3rd degree   DUB (dysfunctional uterine bleeding)    Fibroid    Hypertension    Nonischemic cardiomyopathy (HCC)    EF 35% 2013   Pacemaker  Medtronic    medtronic- placed 2010   Syncope    heart rate < normal, passed out    Past Surgical History:  Procedure Laterality Date   COLONOSCOPY  2007   Normal    INSERT / REPLACE / REMOVE PACEMAKER     ORIF ELBOW FRACTURE  03/17/2012   Procedure: OPEN REDUCTION INTERNAL FIXATION (ORIF) ELBOW/OLECRANON FRACTURE;  Surgeon: Ronn Cohn, MD;  Location: [facility] OR;  Service: Orthopedics;  Laterality: Right;  RIGHT ELBOW ORIF WITH LIGAMENT REPAIR,  RECONSTRUCTION   PACEMAKER INSERTION  2010   PPM GENERATOR CHANGEOUT N/A 02/04/2022   Procedure: PPM GENERATOR CHANGEOUT;  Surgeon: Verona Goodwill, MD;  Location: [facility] INVASIVE CV LAB;  Service: Cardiovascular;  Laterality: N/A;   VAGINAL HYSTERECTOMY  07-06-01   Allergies  Allergen Reactions   Prednisone     GI distress      Objective:    Physical Exam Vitals and nursing note reviewed.  Constitutional:      Appearance: Normal appearance.  Cardiovascular:     Rate and Rhythm: Normal rate and regular rhythm.  Pulmonary:     Effort: Pulmonary effort is normal.      Breath sounds: Normal breath sounds.  Musculoskeletal:        General: Normal range of motion.  Skin:    General: Skin is warm and dry.     Findings: Abrasion (shallow, bilateral knees, no skin flap, sanguinous drainage, approx. 6cm in diameter right knee and 5cm on left knee) present.  Neurological:     Mental Status: She is alert.  Psychiatric:        Mood and Affect: Mood normal.        Behavior: Behavior normal.    BP 118/72 (BP Location: Left Arm, Patient Position: Sitting, Cuff Size: Normal)   Pulse 68   Temp (!) 97.3 F (36.3 C) (Temporal)   Ht 5\' 4"  (1.626 m)   Wt 111 lb 12.8 oz (50.7 kg)   SpO2 98%   BMI 19.19 kg/m  Wt Readings from Last 3 Encounters:  02/16/24 111 lb 12.8 oz (50.7 kg)  08/04/23 115 lb (52.2 kg)  07/22/23 114 lb 6.4 oz (51.9 kg)      Versa Gore, NP

## 2024-02-19 ENCOUNTER — Encounter: Payer: Self-pay | Admitting: Internal Medicine

## 2024-03-19 NOTE — Progress Notes (Signed)
 Remote pacemaker transmission.

## 2024-04-06 ENCOUNTER — Other Ambulatory Visit: Payer: Self-pay

## 2024-04-06 ENCOUNTER — Telehealth: Payer: Self-pay | Admitting: Physician Assistant

## 2024-04-06 DIAGNOSIS — Z1211 Encounter for screening for malignant neoplasm of colon: Secondary | ICD-10-CM

## 2024-04-06 NOTE — Telephone Encounter (Signed)
 Reason for Referral Request:  Colonoscopy  Has Patient been seen by PCP for this complaint? States yes  No, Please schedule patient for appointment for complaint.  Yes, Please find out following information:  Reason: Colonoscopy  Referral to which Specialty: Gastroenterology  Preferred office/ provider:

## 2024-04-06 NOTE — Telephone Encounter (Signed)
 Placed referral to Gastroenterology for colon cancer screening (colonoscopy), pt sent MyChart message with referral info

## 2024-05-05 ENCOUNTER — Other Ambulatory Visit: Payer: Self-pay | Admitting: Internal Medicine

## 2024-05-06 ENCOUNTER — Ambulatory Visit (INDEPENDENT_AMBULATORY_CARE_PROVIDER_SITE_OTHER): Payer: BC Managed Care – PPO

## 2024-05-06 ENCOUNTER — Ambulatory Visit: Payer: Self-pay

## 2024-05-06 DIAGNOSIS — I428 Other cardiomyopathies: Secondary | ICD-10-CM | POA: Diagnosis not present

## 2024-05-06 NOTE — Telephone Encounter (Signed)
 Additional Information . Commented on: Answer Assessment    Pt reports spouse is currently hospitalized and tested positive for COVID. Pt is asymptomatic, but wanted to self-test.  Protocols used: Information Only Call - No Triage-A-AH

## 2024-05-06 NOTE — Telephone Encounter (Signed)
 FYI Only or Action Required?: FYI only for provider.  Patient was last seen in primary care on 02/16/2024 by Lucius Krabbe, NP.  Called Nurse Triage reporting Advice Only.  Symptoms began N/A - pt needs guidance on how to take self-COVID test.  I Triage Disposition: Information or Advice Only Call  Patient/caregiver understands and will follow disposition?: Yes      Copied from CRM [id number]. Topic: Clinical - Medical Advice >> May 06, 2024  5:30 PM Shereese L wrote: Reason for CRM: patient is needing help to do a self covid test Reason for Disposition  General information question, no triage required and triager able to answer question  Answer Assessment - Initial Assessment Questions 1. REASON FOR CALL: What is the main reason for your call? or How can I best help you?     Pt needing help with self-COVID test. Triager advised patient to look up a video online for step-by-step instruction. Pt was able to locate video for guidance. 2. SYMPTOMS : Do you have any symptoms?      denies 3. OTHER QUESTIONS: Do you have any other questions?     N/a  Protocols used: Information Only Call - No Triage-A-AH

## 2024-05-07 ENCOUNTER — Ambulatory Visit: Payer: Self-pay

## 2024-05-07 LAB — CUP PACEART REMOTE DEVICE CHECK
Battery Remaining Longevity: 122 mo
Battery Voltage: 3.02 V
Brady Statistic AP VP Percent: 16.47 %
Brady Statistic AP VS Percent: 0 %
Brady Statistic AS VP Percent: 83.51 %
Brady Statistic AS VS Percent: 0.02 %
Brady Statistic RA Percent Paced: 16.45 %
Brady Statistic RV Percent Paced: 99.98 %
Date Time Interrogation Session: 20250709231948
Implantable Lead Connection Status: 753985
Implantable Lead Connection Status: 753985
Implantable Lead Implant Date: 20100701
Implantable Lead Implant Date: 20100701
Implantable Lead Location: 753859
Implantable Lead Location: 753860
Implantable Lead Model: 5076
Implantable Lead Model: 5076
Implantable Pulse Generator Implant Date: 20230410
Lead Channel Impedance Value: 323 Ohm
Lead Channel Impedance Value: 399 Ohm
Lead Channel Impedance Value: 456 Ohm
Lead Channel Impedance Value: 551 Ohm
Lead Channel Pacing Threshold Amplitude: 0.625 V
Lead Channel Pacing Threshold Amplitude: 1 V
Lead Channel Pacing Threshold Pulse Width: 0.4 ms
Lead Channel Pacing Threshold Pulse Width: 0.4 ms
Lead Channel Sensing Intrinsic Amplitude: 0.625 mV
Lead Channel Sensing Intrinsic Amplitude: 0.625 mV
Lead Channel Setting Pacing Amplitude: 1.5 V
Lead Channel Setting Pacing Amplitude: 2 V
Lead Channel Setting Pacing Pulse Width: 0.4 ms
Lead Channel Setting Sensing Sensitivity: 4 mV
Zone Setting Status: 755011
Zone Setting Status: 755011

## 2024-05-07 NOTE — Telephone Encounter (Signed)
 Pt spoke with triage yesterday to report spouse in hospital and positive for Covid; pt wanted direction on Covid at home testing instructions; also called to speak with triage again today. Please see note and advise next steps for patient

## 2024-05-07 NOTE — Telephone Encounter (Signed)
 Patient was directed by triage nurse and nothing further needed at this time per note.

## 2024-05-07 NOTE — Telephone Encounter (Signed)
 FYI Only or Action Required?: FYI only for provider.  Patient was last seen in primary care on 02/16/2024 by Lucius Krabbe, NP.  Called Nurse Triage reporting Adenopathy.  Symptoms began yesterday.  Interventions attempted: Nothing.  Symptoms are: gradually worsening.  Triage Disposition: No disposition on file.  Patient/caregiver understands and will follow disposition?:    Instructed to make an appointment and declined.   Copied from CRM [id number]. Topic: Clinical - Red Word Triage >> May 07, 2024  7:48 AM Robinson H wrote: [facility] that prompted transfer to Nurse Triage: Swollen glands Reason for Disposition  [1] Very tender to the touch AND [2] no fever  Answer Assessment - Initial Assessment Questions 1. LOCATION: Where is the swollen node located? Is the matching node on the other side of the body also swollen?      Behind earlobes bilaterally 2. SIZE: How big is the node? (e.g., inches or centimeters; or compared to common objects such as pea, bean, marble, golf ball)      Just swollen, tiny 3. ONSET: When did the swelling start?      yesterday 4. NECK NODES: Is there a sore throat, runny nose or other symptoms of a cold?      no 5. GROIN OR ARMPIT NODES: Is there a sore, scratch, cut or painful red area on that arm or leg?      no 6. FEVER: Do you have a fever? If Yes, ask: What is it, how was it measured, and when did it start?      no 7. CAUSE: What do you think is causing the swollen lymph nodes?     no 8. OTHER SYMPTOMS: Do you have any other symptoms? (e.g., node is tender to touch, skin redness over node, weight changes)     no 9. PREGNANCY: Is there any chance you are pregnant? When was your last menstrual period?     na  Protocols used: Lymph Nodes - Swollen-A-AH

## 2024-05-12 ENCOUNTER — Ambulatory Visit: Payer: Self-pay | Admitting: Cardiology

## 2024-05-14 LAB — HM MAMMOGRAPHY

## 2024-05-19 ENCOUNTER — Encounter: Payer: Self-pay | Admitting: Physician Assistant

## 2024-07-22 ENCOUNTER — Encounter: Payer: BC Managed Care – PPO | Admitting: Physician Assistant

## 2024-08-05 ENCOUNTER — Ambulatory Visit (INDEPENDENT_AMBULATORY_CARE_PROVIDER_SITE_OTHER): Payer: BC Managed Care – PPO

## 2024-08-05 DIAGNOSIS — I428 Other cardiomyopathies: Secondary | ICD-10-CM | POA: Diagnosis not present

## 2024-08-06 ENCOUNTER — Encounter: Admitting: Physician Assistant

## 2024-08-06 LAB — CUP PACEART REMOTE DEVICE CHECK
Battery Remaining Longevity: 109 mo
Battery Voltage: 3.01 V
Brady Statistic AP VP Percent: 18.05 %
Brady Statistic AP VS Percent: 0 %
Brady Statistic AS VP Percent: 81.38 %
Brady Statistic AS VS Percent: 0.57 %
Brady Statistic RA Percent Paced: 18.46 %
Brady Statistic RV Percent Paced: 99.43 %
Date Time Interrogation Session: 20251009043011
Implantable Lead Connection Status: 753985
Implantable Lead Connection Status: 753985
Implantable Lead Implant Date: 20100701
Implantable Lead Implant Date: 20100701
Implantable Lead Location: 753859
Implantable Lead Location: 753860
Implantable Lead Model: 5076
Implantable Lead Model: 5076
Implantable Pulse Generator Implant Date: 20230410
Lead Channel Impedance Value: 342 Ohm
Lead Channel Impedance Value: 399 Ohm
Lead Channel Impedance Value: 456 Ohm
Lead Channel Impedance Value: 532 Ohm
Lead Channel Pacing Threshold Amplitude: 0.5 V
Lead Channel Pacing Threshold Amplitude: 1 V
Lead Channel Pacing Threshold Pulse Width: 0.4 ms
Lead Channel Pacing Threshold Pulse Width: 0.4 ms
Lead Channel Sensing Intrinsic Amplitude: 0.625 mV
Lead Channel Sensing Intrinsic Amplitude: 0.625 mV
Lead Channel Setting Pacing Amplitude: 1.5 V
Lead Channel Setting Pacing Amplitude: 2.25 V
Lead Channel Setting Pacing Pulse Width: 0.4 ms
Lead Channel Setting Sensing Sensitivity: 4 mV
Zone Setting Status: 755011
Zone Setting Status: 755011

## 2024-08-06 NOTE — Progress Notes (Signed)
 Remote PPM Transmission

## 2024-08-08 ENCOUNTER — Ambulatory Visit: Payer: Self-pay | Admitting: Cardiology

## 2024-08-09 NOTE — Progress Notes (Signed)
 Remote PPM Transmission

## 2024-09-17 ENCOUNTER — Telehealth: Payer: Self-pay

## 2024-09-17 NOTE — Telephone Encounter (Signed)
 Pt called in to ask about an update on her appt status due to medicare issue. I ran her medicare and verified part a and b are active. She would like to be able to have her physical as originally scheduled and she would like either a msg via mychart or a callback to confirm what she can drink before doing her labs.

## 2024-09-17 NOTE — Telephone Encounter (Signed)
 Copied from CRM [id number]. Topic: General - Other >> Sep 17, 2024 10:52 AM Revonda D wrote: Reason for CRM: recently enrolled in Medicare part B, states agent sent through yesterday. Wants to keep Physical only if part B can cover. Callback [phone]  UPDATE: Pt is calling back to verify if the office received the part B insurance and would like a callback with an update. Pt has a physical scheduled on 11/25 but doesn't want to keep the appt if she has to pay out of pocket. Pt stated that the insurance rep stated it was sent over yesterday.  Can someone please look into this and advise anything I can do on my end to help

## 2024-09-17 NOTE — Telephone Encounter (Signed)
 Patient states she only has traditional medicare now.   I have changed appt to Welcome to Medicare and notified the patient.

## 2024-09-20 NOTE — Telephone Encounter (Signed)
 Pt has just started traditional Medicare B meaning she is due for WTM before 08/28/2025. Does provider want to schedule this at another time?

## 2024-09-20 NOTE — Telephone Encounter (Signed)
 Melissa [name] advised this morning that pt only has Medicare A & B now, called pt to confirm and she states this is correct but also has [employer] as well. Pt still scheduled for tomorrow morning and aware to fast for labs

## 2024-09-21 ENCOUNTER — Encounter: Payer: Self-pay | Admitting: Physician Assistant

## 2024-09-21 ENCOUNTER — Ambulatory Visit: Admitting: Physician Assistant

## 2024-09-21 ENCOUNTER — Telehealth: Payer: Self-pay

## 2024-09-21 VITALS — BP 106/64 | HR 65 | Temp 97.3°F | Resp 20 | Ht 61.42 in | Wt 115.0 lb

## 2024-09-21 DIAGNOSIS — Z95811 Presence of heart assist device: Secondary | ICD-10-CM

## 2024-09-21 DIAGNOSIS — M8589 Other specified disorders of bone density and structure, multiple sites: Secondary | ICD-10-CM | POA: Diagnosis not present

## 2024-09-21 DIAGNOSIS — Z0000 Encounter for general adult medical examination without abnormal findings: Secondary | ICD-10-CM | POA: Diagnosis not present

## 2024-09-21 DIAGNOSIS — E7800 Pure hypercholesterolemia, unspecified: Secondary | ICD-10-CM | POA: Diagnosis not present

## 2024-09-21 DIAGNOSIS — Z01110 Encounter for hearing examination following failed hearing screening: Secondary | ICD-10-CM

## 2024-09-21 DIAGNOSIS — M4124 Other idiopathic scoliosis, thoracic region: Secondary | ICD-10-CM

## 2024-09-21 DIAGNOSIS — Z131 Encounter for screening for diabetes mellitus: Secondary | ICD-10-CM | POA: Diagnosis not present

## 2024-09-21 DIAGNOSIS — Z1211 Encounter for screening for malignant neoplasm of colon: Secondary | ICD-10-CM

## 2024-09-21 DIAGNOSIS — I10 Essential (primary) hypertension: Secondary | ICD-10-CM

## 2024-09-21 DIAGNOSIS — H6123 Impacted cerumen, bilateral: Secondary | ICD-10-CM

## 2024-09-21 LAB — CBC WITH DIFFERENTIAL/PLATELET
Basophils Absolute: 0 K/uL (ref 0.0–0.1)
Basophils Relative: 0.7 % (ref 0.0–3.0)
Eosinophils Absolute: 0.3 K/uL (ref 0.0–0.7)
Eosinophils Relative: 4.7 % (ref 0.0–5.0)
HCT: 39.6 % (ref 36.0–46.0)
Hemoglobin: 13.5 g/dL (ref 12.0–15.0)
Lymphocytes Relative: 30.4 % (ref 12.0–46.0)
Lymphs Abs: 1.6 K/uL (ref 0.7–4.0)
MCHC: 34.1 g/dL (ref 30.0–36.0)
MCV: 95.3 fl (ref 78.0–100.0)
Monocytes Absolute: 0.4 K/uL (ref 0.1–1.0)
Monocytes Relative: 8 % (ref 3.0–12.0)
Neutro Abs: 3 K/uL (ref 1.4–7.7)
Neutrophils Relative %: 56.2 % (ref 43.0–77.0)
Platelets: 226 K/uL (ref 150.0–400.0)
RBC: 4.16 Mil/uL (ref 3.87–5.11)
RDW: 13.2 % (ref 11.5–15.5)
WBC: 5.4 K/uL (ref 4.0–10.5)

## 2024-09-21 LAB — COMPREHENSIVE METABOLIC PANEL WITH GFR
ALT: 17 U/L (ref 0–35)
AST: 22 U/L (ref 0–37)
Albumin: 4.3 g/dL (ref 3.5–5.2)
Alkaline Phosphatase: 42 U/L (ref 39–117)
BUN: 18 mg/dL (ref 6–23)
CO2: 31 meq/L (ref 19–32)
Calcium: 9.7 mg/dL (ref 8.4–10.5)
Chloride: 105 meq/L (ref 96–112)
Creatinine, Ser: 0.69 mg/dL (ref 0.40–1.20)
GFR: 85.48 mL/min (ref >60.00)
Glucose, Bld: 91 mg/dL (ref 70–99)
Potassium: 4.1 meq/L (ref 3.5–5.1)
Sodium: 142 meq/L (ref 135–145)
Total Bilirubin: 0.7 mg/dL (ref 0.2–1.2)
Total Protein: 6.5 g/dL (ref 6.0–8.3)

## 2024-09-21 LAB — LIPID PANEL
Cholesterol: 147 mg/dL (ref 0–200)
HDL: 63 mg/dL (ref >39.00)
LDL Cholesterol: 72 mg/dL (ref 0–99)
NonHDL: 84.36
Total CHOL/HDL Ratio: 2
Triglycerides: 61 mg/dL (ref 0.0–149.0)
VLDL: 12.2 mg/dL (ref 0.0–40.0)

## 2024-09-21 LAB — HEMOGLOBIN A1C: Hgb A1c MFr Bld: 5.6 % (ref 4.6–6.5)

## 2024-09-21 LAB — TSH: TSH: 2.54 u[IU]/mL (ref 0.35–5.50)

## 2024-09-21 MED ORDER — DEBROX 6.5 % OT SOLN
5.0000 [drp] | Freq: Two times a day (BID) | OTIC | 0 refills | Status: AC
Start: 2024-09-21 — End: 2024-09-28

## 2024-09-21 NOTE — Telephone Encounter (Signed)
 Copied from CRM #[id number]. Topic: General - Billing Inquiry >> Sep 21, 2024 11:38 AM Harlene ORN wrote: Reason for CRM: Wants to know if she can get her HSA via her PCP. Please call back to discuss more with the patient.  Returned pt call and was confused thinking she got her HSA benefit card through her PCP office. Advised pt she would need to contact her insurance carrier to see if she has this benefit on her current policy. Pt verbalized understanding and nothing further needed at this time.

## 2024-09-21 NOTE — Patient Instructions (Addendum)
 Use the [facility] drops Labs today Keep being active - good work!   Happy holidays!  Dexa bone density next summer - call me for this order  Schedule with your new heart doctor

## 2024-09-21 NOTE — Progress Notes (Signed)
 Annual Wellness Visit   Patient: Melissa [name], Female    DOB: 03-26-1951, 73 y.o.   MRN: [id number]  Chief Complaint  Patient presents with  . Welcome to Medicare    Pt in office with newly Part A & B Medicare,     Subjective:    Melissa [name] is a 73 y.o. female who presents today for her Annual Wellness Visit. She reports consuming a general diet. Home exercise routine includes walking 1 hrs per day. Gym/ health club routine includes walking on track . She generally feels well. She reports sleeping well. She does not have additional problems to discuss today.   HPI  Discussed the use of AI scribe software for clinical note transcription with the patient, who gave verbal consent to proceed.  History of Present Illness Melissa [name] is a 73 year old female who presents for her Welcome to Medicare physical and annual wellness visit.  She has a history of non-ischemic cardiomyopathy and a pacemaker, with no issues reported regarding the pacemaker's function. She has not yet met her new cardiologist after her previous cardiologist retired.  She maintains an active lifestyle, exercising at the gym six days a week, focusing on stomach muscles, pull downs, and walking for an hour. She avoids swimming due to the pool's temperature. Her gym membership is provided through [employer].  She reports good sleep and mental health, with no substance use. She wears glasses but experiences discomfort, leading her to take Tylenol  once daily. She is due for an eye exam in January.  She has a history of osteopenia, with the last DEXA scan performed in October 2023. She is due for a bone density test next year. She is also due for a colonoscopy screening, and she gets her mammograms annually.  She recalls a past episode of sneezing triggered by a strong aftershave scent but reports no ongoing allergy issues.  She has scoliosis, diagnosed at age 35, and reports no current back pain. She wore a  brace during her youth to manage the condition.  No recent health changes, heart pain, shortness of breath, blood in stool or urine, and bowel movements are okay.   Medications: Outpatient Medications Prior to Visit  Medication Sig  . acetaminophen  (TYLENOL ) 325 MG tablet Take 325 mg by mouth every 6 (six) hours as needed for headache.  . Ascorbic Acid (VITAMIN C PO) Take 1 tablet by mouth daily.  . atorvastatin  (LIPITOR) 20 MG tablet TAKE 1 TABLET BY MOUTH EVERY DAY  . Calcium  Carb-Cholecalciferol 600-20 MG-MCG TABS Take 1 tablet by mouth 2 (two) times daily.  . carvedilol  (COREG ) 12.5 MG tablet TAKE 1 TABLET (12.5MG  TOTAL) BY MOUTH TWICE A DAY WITH MEALS  . ramipril  (ALTACE ) 5 MG capsule TAKE 1 CAPSULE BY MOUTH EVERY DAY   No facility-administered medications prior to visit.    Allergies  Allergen Reactions  . Prednisone     GI distress    Patient Care Team: Melissa Monday M, PA-C as PCP - General (Physician Assistant)  ROS  SEE HPI FOR PERTINENT POSITIVES AND NEGATIVES.   Objective:  Objective  BP 106/64 (BP Location: Left Arm, Patient Position: Sitting, Cuff Size: Normal)   Pulse 65   Temp (!) 97.3 F (36.3 C) (Temporal)   Resp 20   Ht 5' 1.42 (1.56 [name])   Wt 115 lb (52.2 kg)   SpO2 98%   BMI 21.43 kg/[name]    Physical Exam Vitals and nursing note reviewed.  Constitutional:  General: She is not in acute distress.    Appearance: Normal appearance. She is normal weight. She is not ill-appearing or toxic-appearing.  HENT:     Head: Normocephalic and atraumatic.     Right Ear: Ear canal and external ear normal. There is impacted cerumen.     Left Ear: Ear canal and external ear normal. There is impacted cerumen.     Nose: Nose normal.     Mouth/Throat:     Mouth: Mucous membranes are moist.  Eyes:     Extraocular Movements: Extraocular movements intact.     Conjunctiva/sclera: Conjunctivae normal.     Pupils: Pupils are equal, round, and reactive to light.      Comments: Wears glasses  Cardiovascular:     Rate and Rhythm: Normal rate and regular rhythm.     Pulses: Normal pulses.     Heart sounds: Normal heart sounds.     Comments: paced Pulmonary:     Effort: Pulmonary effort is normal.     Breath sounds: Normal breath sounds.  Abdominal:     General: Abdomen is flat. Bowel sounds are normal.     Palpations: Abdomen is soft. There is no mass.     Tenderness: There is no right CVA tenderness, left CVA tenderness or guarding.  Musculoskeletal:        General: Deformity present. Normal range of motion.     Cervical back: Normal range of motion and neck supple.     Right lower leg: No edema.     Left lower leg: No edema.     Comments: Significant scoliosis of spine noted  Skin:    General: Skin is warm and dry.     Findings: No rash.  Neurological:     General: No focal deficit present.     Mental Status: She is alert and oriented to person, place, and time.  Psychiatric:        Mood and Affect: Mood normal.        Behavior: Behavior normal.        Thought Content: Thought content normal.        Judgment: Judgment normal.       Most recent depression screenings:    09/21/2024    7:55 AM 07/22/2023    8:01 AM  PHQ 2/9 Scores  PHQ - 2 Score 0 0  PHQ- 9 Score  0      Data saved with a previous flowsheet row definition    Visit info / Clinical Intake: Medicare Wellness Visit Type:: Welcome to [employer] GOVERNMENT SOCIAL RESEARCH OFFICER) Persons participating in visit:: patient Medicare Wellness Visit Mode:: In-person (required for WTM) Information given by:: patient Interpreter Needed?: No Pre-visit prep was completed: no AWV questionnaire completed by patient prior to visit?: no Living arrangements:: lives with spouse/significant other Patient's Overall Health Status Rating: very good Typical amount of pain: none Does pain affect daily life?: no Are you currently prescribed opioids?: no  Dietary Habits and Nutritional Risks How many meals a  day?: 2 Eats fruit and vegetables daily?: yes Most meals are obtained by: preparing own meals In the last 2 weeks, have you had any of the following?: none Diabetic:: no  Functional Status Activities of Daily Living (to include ambulation/medication): Independent Ambulation: Independent Medication Administration: Independent Home Management: Independent Manage your own finances?: yes Primary transportation is: driving Concerns about vision?: (!) yes (Patient has cataract and vision needs to be checked again per patient. Patient has upcoming appt) Concerns about hearing?: ROLLEN)  yes Uses hearing aids?: no Hear whispered voice?: (!) no *in-person visit only*  Fall Screening Falls in the past year?: 0 Number of falls in past year: 0 Was there an injury with Fall?: 0 Fall Risk Category Calculator: 0 Patient Fall Risk Level: Low Fall Risk  Fall Risk Patient at Risk for Falls Due to: No Fall Risks Fall risk Follow up: Falls evaluation completed  Home and Transportation Safety: All rugs have non-skid backing?: yes All stairs or steps have railings?: yes Grab bars in the bathtub or shower?: yes Have non-skid surface in bathtub or shower?: yes Good home lighting?: yes Regular seat belt use?: yes Hospital stays in the last year:: no  Cognitive Assessment Difficulty concentrating, remembering, or making decisions? : no Will 6CIT or Mini Cog be Completed: no 6CIT or Mini Cog Declined: patient alert, oriented, able to answer questions appropriately and recall recent events  Advance Directives (For Healthcare) Does Patient Have a Medical Advance Directive?: Yes Does patient want to make changes to medical advance directive?: Yes (MAU/Ambulatory/Procedural Areas - Information given) Type of Advance Directive: Living will; Healthcare Power of Attorney Copy of Healthcare Power of Attorney in Chart?: No - copy requested Copy of Living Will in Chart?: No - copy requested  Reviewed/Updated   Reviewed/Updated: Reviewed All (Medical, Surgical, Family, Medications, Allergies, Care Teams, Patient Goals); Medical History; Surgical History; Family History; Medications; Allergies    Vision/Hearing Screen: Hearing Screening   1000Hz  2000Hz  3000Hz  4000Hz   Right ear Refer Pass Pass Refer  Left ear Pass Refer Pass Refer   Vision Screening   Right eye Left eye Both eyes  Without correction     With correction 20/50 20/60 20/50      No results found for any visits on 09/21/24.   Assessment & Plan:    Annual wellness visit done today including the all of the following: Reviewed patient's Family and Medical History Reviewed and updated list of patient's medical providers Assessment of cognitive impairment was done Assessed patient's functional ability Established a written schedule for health screening services Health Risk Assessment Completed and Reviewed Advanced Directives were discussed today- form provided  Exercise Activities and Dietary recommendations  Goals   None     Immunization History  Administered Date(s) Administered  . Fluad Quad(high Dose 65+) 07/03/2018, 07/12/2020, 07/12/2020, 07/17/2021, 07/16/2022  . Fluad Trivalent(High Dose 65+) 07/03/2018  . INFLUENZA, HIGH DOSE SEASONAL PF 07/12/2019, 07/12/2023, 08/05/2024  . Influenza Inj Mdck Quad Pf 07/25/2015  . Influenza Split 08/31/2013  . Influenza, Quadrivalent, Recombinant, Inj, Pf 08/01/2016  . Influenza,inj,quad, With Preservative 07/25/2015  . Influenza,trivalent, recombinat, inj, PF 08/31/2013  . Influenza-Unspecified 08/15/2017, 07/12/2019  . Moderna Covid-19 Fall Seasonal Vaccine 65yrs & older 07/18/2022, 04/30/2023, 07/12/2023, 05/07/2024  . PFIZER Comirnaty(Gray Top)Covid-19 Tri-Sucrose Vaccine 01/25/2021  . PFIZER(Purple Top)SARS-COV-2 Vaccination 11/20/2019, 12/11/2019, 07/22/2020  . Pfizer Covid-19 Vaccine Bivalent Booster 57yrs & up 07/17/2021  . Pneumococcal Conjugate Pcv21,  Polysaccharide Crm197 Conjugaf 05/07/2024  . Pneumococcal Conjugate-13 09/08/2015  . Pneumococcal Polysaccharide-23 06/27/2017  . Tdap 02/29/2008, 03/13/2012, 03/27/2015  . Zoster Recombinant(Shingrix) 01/14/2022  . Zoster, Live 02/28/2009, 03/04/2013, 10/19/2015    Health Maintenance  Topic Date Due  . Hepatitis C Screening  Never done  . COVID-19 Vaccine (10 - 2025-26 season) 10/07/2024 (Originally 07/02/2024)  . Zoster Vaccines- Shingrix (2 of 2) 12/22/2024 (Originally 03/11/2022)  . Colonoscopy  09/21/2025 (Originally 03/03/2016)  . DTaP/Tdap/Td (4 - Td or Tdap) 03/26/2025  . Mammogram  05/14/2025  . Medicare Annual Wellness (AWV)  09/21/2025  . Pneumococcal Vaccine: 50+ Years  Completed  . Influenza Vaccine  Completed  . Bone Density Scan  Completed  . Meningococcal B Vaccine  Aged Out    Discussed health benefits of physical activity, and encouraged her to engage in regular exercise appropriate for her age and condition.    Problem List Items Addressed This Visit       Cardiovascular and Mediastinum   Primary hypertension   Relevant Orders   Comprehensive metabolic panel with GFR     Musculoskeletal and Integument   Other idiopathic scoliosis, thoracic region   Osteopenia of multiple sites     Other   Presence of heart assist device [facility])   Other Visit Diagnoses       Welcome to Medicare preventive visit    -  Primary   Relevant Orders   CBC with Differential/Platelet   Comprehensive metabolic panel with GFR   Lipid panel   TSH   Hemoglobin A1c     Screening for colon cancer       Relevant Orders   Ambulatory referral to Gastroenterology     Encounter for hearing examination after failed hearing screening         High cholesterol       Relevant Orders   Lipid panel     Bilateral impacted cerumen       Relevant Medications   carbamide peroxide (DEBROX) 6.5 % OTIC solution       Assessment and Plan Assessment & Plan Adult Wellness Visit Routine  wellness visit with no significant health changes. Engages in regular exercise and maintains a balanced diet. No substance use. Good sleep and mental health. ASQ screening normal. Due for colonoscopy screening. Annual mammograms maintained. DEXA scan in October 2023 showed osteopenia. Transitioning to new cardiologist, Dr. Sidra. - Ordered blood work - Referred for colonoscopy screening - Scheduled follow-up appointment after the new year for ear cleaning  Impacted cerumen, bilateral Bilateral impacted cerumen causing hearing issues. No need for audiology referral as wax removal is expected to resolve the issue. - Prescribed ear drops for wax removal - Scheduled follow-up appointment for ear cleaning after the new year  Osteopenia Noted on previous DEXA scan. Due for bone density scan next year. - Will schedule bone density scan for next year  Other idiopathic scoliosis, thoracic region Thoracic scoliosis since age 56 with no current pain or functional limitations. Managed with previous bracing. - Continue current management as no pain or functional limitations are present    Return in about 6 weeks (around 11/02/2024) for CERUMEN REMOVAL BILATERAL .     Kamala Kolton [name] Chi Garlow, PA-C [facility] [address]

## 2024-09-27 ENCOUNTER — Ambulatory Visit: Payer: Self-pay | Admitting: Physician Assistant

## 2024-10-11 ENCOUNTER — Telehealth: Payer: Self-pay | Admitting: Cardiology

## 2024-10-11 ENCOUNTER — Other Ambulatory Visit: Payer: Self-pay | Admitting: Physician Assistant

## 2024-10-11 MED ORDER — RAMIPRIL 5 MG PO CAPS: ORAL_CAPSULE | ORAL | 0 refills | Status: DC

## 2024-10-11 NOTE — Telephone Encounter (Signed)
 Copied from CRM #[id number]. Topic: Clinical - Medication Refill >> Oct 11, 2024  9:25 AM [name] wrote: Medication: ramipril  (ALTACE ) 5 MG capsule - Would like to know If PA Alyssa Allwardt could get this refilled for her , as her cardiologist has retired and she doesn't have a appointment with new cardiologist until February ,   Has the patient contacted their pharmacy? Yes (Agent: If no, request that the patient contact the pharmacy for the refill. If patient does not wish to contact the pharmacy document the reason why and proceed with request.) (Agent: If yes, when and what did the pharmacy advise?)  This is the patient's preferred pharmacy:  [id number] [address], [place] - [address] [place] [address] Phone: [phone] Fax: [phone]  Is this the correct pharmacy for this prescription? Yes If no, delete pharmacy and type the correct one.   Has the prescription been filled recently? No  Is the patient out of the medication? Yes  Has the patient been seen for an appointment in the last year OR does the patient have an upcoming appointment? Yes  Can we respond through MyChart? Yes  Agent: Please be advised that Rx refills may take up to 3 business days. We ask that you follow-up with your pharmacy.

## 2024-10-11 NOTE — Telephone Encounter (Signed)
°*  STAT* If patient is at the pharmacy, call can be transferred to refill team.   1. Which medications need to be refilled? (please list name of each medication and dose if known)   ramipril  (ALTACE ) 5 MG capsule     2. Would you like to learn more about the convenience, safety, & potential cost savings by using the [facility]? No    3. Are you open to using the [facility] (Type [facility]. ). No    4. Which pharmacy/location (including street and city if local pharmacy) is medication to be sent to? [id number] [address], [place] - [address]     5. Do they need a 30 day or 90 day supply? 90 day supply    Pt is out of medication/ former pt of Dr Fernande appt scheduled for 2/26

## 2024-10-27 ENCOUNTER — Encounter: Payer: Self-pay | Admitting: Physician Assistant

## 2024-10-27 ENCOUNTER — Encounter (INDEPENDENT_AMBULATORY_CARE_PROVIDER_SITE_OTHER): Payer: Self-pay

## 2024-10-27 ENCOUNTER — Ambulatory Visit (INDEPENDENT_AMBULATORY_CARE_PROVIDER_SITE_OTHER): Admitting: Physician Assistant

## 2024-10-27 ENCOUNTER — Telehealth (INDEPENDENT_AMBULATORY_CARE_PROVIDER_SITE_OTHER): Payer: Self-pay

## 2024-10-27 VITALS — BP 120/62 | HR 64 | Temp 97.4°F | Ht 61.0 in | Wt 117.8 lb

## 2024-10-27 DIAGNOSIS — R519 Headache, unspecified: Secondary | ICD-10-CM | POA: Diagnosis not present

## 2024-10-27 DIAGNOSIS — H6123 Impacted cerumen, bilateral: Secondary | ICD-10-CM | POA: Diagnosis not present

## 2024-10-27 NOTE — Telephone Encounter (Signed)
 Called patient regarding upcoming visit. I spoke with her about getting an appointment next available since she could not make it today at 1 pm. I tried to give patient directions to the office but the cell service cut out. I called back and left voicemail for her to give us  a call back and sent her basic directions to our office from her home address via MyChart.

## 2024-10-27 NOTE — Progress Notes (Signed)
 "   Patient ID: Melissa [name], female    DOB: June 04, 1951, 73 y.o.   MRN: [id number]   Assessment & Plan:  Bilateral impacted cerumen -     Ambulatory referral to ENT  Occipital headache    Assessment & Plan Impacted cerumen with right ear canal bleeding Impacted cerumen in the right ear canal with bleeding during irrigation. Bleeding source unclear, possibly from the ear canal or tympanic membrane. Muffled hearing post-irrigation. Cerumen disimpaction unsuccessful. - Referred to ENT for evaluation and management of impacted cerumen and bleeding. Provided directions for patient today, as ENT was gracious enough to work her in.  Headache possibly related to cataract or cervical arthritis Headache possibly related to cataract or cervical arthritis. Cataract suspected as a cause per patient, with planned evaluation and potential removal. Differential includes occipital neuralgia or cervical arthritis. No neuro deficits noted.  - Continue with scheduled eye appointment for cataract evaluation and potential removal. - Consider Tylenol  or ibuprofen for headache management, avoiding frequent use to prevent rebound headaches. - Use heating pack for neck pain relief if related to cervical arthritis. - Follow up if headaches persist or worsen before eye appointment.    F/up prn    Subjective:    Chief Complaint  Patient presents with   Medical Management of Chronic Issues    Pt in office for 6 wk follow up; pt wants to discuss if she can do Cologuard versus Colonoscopy since having a driver is an issue;     HPI Discussed the use of AI scribe software for clinical note transcription with the patient, who gave verbal consent to proceed.  History of Present Illness Melissa [name] is a 73 year old female who presents with cerumen impaction and headaches possibly related to cataracts.  She has been experiencing headaches for the past two months, which she attributes to vision issues,  specifically a suspected cataract. A year ago, she was informed of the potential development of a cataract. The headaches are located in the posterior head rather than the eyes and are exacerbated by her vision problems. She has been using over-the-counter pain medications but is hesitant to rely on them. She has an upcoming appointment on January 20th for further evaluation and potential cataract surgery.  She has a history of cerumen impaction, leading to muffled hearing. Attempts to manage this at home with ear drops and a bobby pin were unsuccessful. She continues to experience muffled hearing.  No symptoms of a cold are present. Her blood pressure is well-controlled. She also reports neck pain, which she attributes to possible arthritis or sleeping position.     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   DUB (dysfunctional uterine bleeding)    Fibroid    Hypertension    Nonischemic cardiomyopathy (HCC)    EF 35% 2013   Pacemaker  Medtronic    medtronic- placed 2010   Syncope    heart rate < normal, passed out     Past Surgical History:  Procedure Laterality Date   COLONOSCOPY  2007   Normal    INSERT / REPLACE / REMOVE PACEMAKER     ORIF ELBOW FRACTURE  03/17/2012   Procedure: OPEN REDUCTION INTERNAL FIXATION (ORIF) ELBOW/OLECRANON FRACTURE;  Surgeon: Elsie Mussel, MD;  Location: [facility] OR;  Service: Orthopedics;  Laterality: Right;  RIGHT ELBOW ORIF WITH LIGAMENT REPAIR, RECONSTRUCTION  PACEMAKER INSERTION  2010   PPM GENERATOR CHANGEOUT N/A 02/04/2022   Procedure: PPM GENERATOR CHANGEOUT;  Surgeon: Fernande Elspeth BROCKS, MD;  Location: [facility] INVASIVE CV LAB;  Service: Cardiovascular;  Laterality: N/A;   VAGINAL HYSTERECTOMY  07-06-01    Family History  Problem Relation Age of Onset   Hypertension Mother    Cancer Father        SKIN CANCER   Diabetes Paternal Aunt        GREAT PAT AUNT   Anesthesia  problems Neg Hx     Social History[1]   Allergies[2]  Review of Systems NEGATIVE UNLESS OTHERWISE INDICATED IN HPI      Objective:     BP 120/62 (BP Location: Left Arm, Patient Position: Sitting, Cuff Size: Normal)   Pulse 64   Temp (!) 97.4 F (36.3 C) (Temporal)   Ht 5' 1 (1.549 m)   Wt 117 lb 12.8 oz (53.4 kg)   SpO2 98%   BMI 22.26 kg/m   Wt Readings from Last 3 Encounters:  10/27/24 117 lb 12.8 oz (53.4 kg)  09/21/24 115 lb (52.2 kg)  02/16/24 111 lb 12.8 oz (50.7 kg)    BP Readings from Last 3 Encounters:  10/27/24 120/62  09/21/24 106/64  02/16/24 118/72     Physical Exam Vitals and nursing note reviewed.  Constitutional:      Appearance: Normal appearance.  HENT:     Right Ear: There is impacted cerumen.     Left Ear: There is impacted cerumen.  Eyes:     Extraocular Movements: Extraocular movements intact.     Conjunctiva/sclera: Conjunctivae normal.     Pupils: Pupils are equal, round, and reactive to light.  Cardiovascular:     Rate and Rhythm: Normal rate.  Pulmonary:     Effort: Pulmonary effort is normal.  Skin:    Findings: No rash.  Neurological:     Mental Status: She is alert.  Psychiatric:        Mood and Affect: Mood normal.             Tobby Fawcett M Leonidus Rowand, PA-C     [1]  Social History Tobacco Use   Smoking status: Never   Smokeless tobacco: Never  Vaping Use   Vaping status: Never Used  Substance Use Topics   Alcohol use: No   Drug use: No  [2]  Allergies Allergen Reactions   Prednisone     GI distress   "

## 2024-11-02 ENCOUNTER — Other Ambulatory Visit: Payer: Self-pay | Admitting: Cardiology

## 2024-11-04 ENCOUNTER — Ambulatory Visit (INDEPENDENT_AMBULATORY_CARE_PROVIDER_SITE_OTHER): Admitting: Physician Assistant

## 2024-11-04 ENCOUNTER — Encounter (INDEPENDENT_AMBULATORY_CARE_PROVIDER_SITE_OTHER): Payer: Self-pay | Admitting: Physician Assistant

## 2024-11-04 ENCOUNTER — Ambulatory Visit

## 2024-11-04 ENCOUNTER — Telehealth: Payer: Self-pay

## 2024-11-04 VITALS — BP 145/80 | HR 65 | Temp 97.9°F | Ht 63.0 in | Wt 117.0 lb

## 2024-11-04 DIAGNOSIS — H6123 Impacted cerumen, bilateral: Secondary | ICD-10-CM

## 2024-11-04 DIAGNOSIS — I428 Other cardiomyopathies: Secondary | ICD-10-CM

## 2024-11-04 NOTE — Telephone Encounter (Signed)
 Copied from CRM #[id number]. Topic: Clinical - Medical Advice >> Nov 04, 2024 10:36 AM Nessti S wrote: Reason for CRM: pt called to let pcp know that ent dr flushed out her ears and will have to go back within a month. He would like to see her once a year to flush out ears. Also pt wanted to schedule physical but did not know her last physical. Advised that last physical was 07/22/23 but she stated that she had another one. She would like CMA to give a call back to make sure    Pt completed Welcome to Medicare visit 09/21/24, please advise if annual CPE is needed for patient at this time.

## 2024-11-04 NOTE — Progress Notes (Signed)
 Dear Dr. Kathrene, Here is my assessment for our mutual patient, Melissa [name]. Thank you for allowing me the opportunity to care for your patient. Please do not hesitate to contact me should you have any other questions. Sincerely, Chyrl Cohen PA-C  Otolaryngology Clinic Note Referring provider: Dr. Kathrene HPI:  Melissa [name] is a 74 y.o. female kindly referred by Dr. Kathrene   Discussed the use of AI scribe software for clinical note transcription with the patient, who gave verbal consent to proceed.  History of Present Illness    Melissa [name] is a 74 year old female who presents with bilateral otalgia and cerumen impaction following recent ear cleaning.  She reports persistent cerumen accumulation in both ears and underwent attempted cerumen removal by her primary care provider on December 31st. During the procedure, her right external auditory canal was abraded, resulting in significant pain and transient bleeding. The bleeding has resolved, but she continues to experience pain inferior to the right ear, which is exacerbated by jaw movement, particularly mouth opening. She describes the pain as pounding at times and also notes frequent headaches, though the etiology is unclear.  She denies hearing loss and states her hearing is normal, which is corroborated by her husband. The left ear was not successfully cleaned during the prior attempt by her primary care provider. She describes the cerumen as feeling hard rock and notes marked sensitivity of the ear canals.  She reports frequent sneezing at work, which she attributes to exposure to perfume. She is unaware of any prior tonsillectomy.           Independent Review of Additional Tests or Records:  PCP office visit note on 12.31.2025   PMH/Meds/All/SocHx/FamHx/ROS:   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   DUB (dysfunctional uterine  bleeding)    Fibroid    Hypertension    Nonischemic cardiomyopathy (HCC)    EF 35% 2013   Pacemaker  Medtronic    medtronic- placed 2010   Syncope    heart rate < normal, passed out      Past Surgical History:  Procedure Laterality Date   COLONOSCOPY  2007   Normal    INSERT / REPLACE / REMOVE PACEMAKER     ORIF ELBOW FRACTURE  03/17/2012   Procedure: OPEN REDUCTION INTERNAL FIXATION (ORIF) ELBOW/OLECRANON FRACTURE;  Surgeon: Elsie Mussel, MD;  Location: [facility] OR;  Service: Orthopedics;  Laterality: Right;  RIGHT ELBOW ORIF WITH LIGAMENT REPAIR, RECONSTRUCTION   PACEMAKER INSERTION  2010   PPM GENERATOR CHANGEOUT N/A 02/04/2022   Procedure: PPM GENERATOR CHANGEOUT;  Surgeon: Fernande Elspeth BROCKS, MD;  Location: [facility] INVASIVE CV LAB;  Service: Cardiovascular;  Laterality: N/A;   VAGINAL HYSTERECTOMY  07-06-01    Family History  Problem Relation Age of Onset   Hypertension Mother    Cancer Father        SKIN CANCER   Diabetes Paternal Aunt        GREAT PAT AUNT   Anesthesia problems Neg Hx      Social Connections: Socially Integrated (09/21/2024)   Social Connection and Isolation Panel    Frequency of Communication with Friends and Family: Twice a week    Frequency of Social Gatherings with Friends and Family: Once a week    Attends Religious Services: 1 to 4 times per year    Active Member of [employer]  or Organizations: Yes    Attends Engineer, Structural: More than 4 times per year    Marital Status: Married     Current Medications[1]   Physical Exam:   BP (!) 145/80 Comment: 1st attempt 147/83  Pulse 65   Temp 97.9 F (36.6 C)   Ht 5' 3 (1.6 m)   Wt 117 lb (53.1 kg)   SpO2 95%   BMI 20.73 kg/m   Pertinent Findings  CN II-XII grossly intact Bilateral EACs with cerumen impaction, right EAC with dried blood along the inferior posterior canal, no active bleeding; once cerumen was removed bilateral TMs intact with no signs of trauma Anterior rhinoscopy: Septum left  deviation; bilateral inferior turbinates with no hypertrophy No lesions of oral cavity/oropharynx;  No obviously palpable neck masses/lymphadenopathy/thyromegaly No respiratory distress or stridor       Seprately Identifiable Procedures:  Procedure: Bilateral ear microscopy and cerumen removal using microscope (CPT [phone]) - Mod 50 Pre-procedure diagnosis: bilateral cerumen impaction external auditory canals Post-procedure diagnosis: same Indication: bilateral cerumen impaction; given patient's otologic complaints and history as well as for improved and comprehensive examination of external ear and tympanic membrane, bilateral otologic examination using microscope was performed and impacted cerumen removed  Procedure: Patient was placed semi-recumbent. Both ear canals were examined using the microscope with findings above. Cerumen removed from bilateral external auditory canals using suction and currette with improvement in EAC examination and patency. Left: EAC was patent. TM was intact . Middle ear was aerated. Drainage: none Right: EAC was patent. TM was intact . Middle ear was aerated . Drainage: none Patient tolerated the procedure well.   Impression & Plans:  Melissa [name] is a 74 y.o. 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 Left canal irritation with minor bleeding today after cerumen removal. Expected resolution with conservative management. - Applied peroxide drops to soften cerumen. - Removed cerumen, avoiding right canal scab. - Advised against inserting objects into ears. - Recommended follow-up in one month to reassess right canal healing and assure no underlying lesions - Educated on cerumen's natural formation and function.           - f/u 1 month   Thank you for allowing me the opportunity  to care for your patient. Please do not hesitate to contact me should you have any other questions.  Sincerely, Chyrl Cohen PA-C [facility] Phone: [phone] Fax: [phone]  11/04/2024, 12:02 PM        [1]  Current Outpatient Medications:    acetaminophen  (TYLENOL ) 325 MG tablet, Take 325 mg by mouth every 6 (six) hours as needed for headache., Disp: , Rfl:    Ascorbic Acid (VITAMIN C PO), Take 1 tablet by mouth daily., Disp: , Rfl:    atorvastatin  (LIPITOR) 20 MG tablet, TAKE 1 TABLET BY MOUTH EVERY DAY, Disp: 90 tablet, Rfl: 1   Calcium  Carb-Cholecalciferol 600-20 MG-MCG TABS, Take 1 tablet by mouth 2 (two) times daily., Disp: , Rfl:    carvedilol  (COREG ) 12.5 MG tablet, TAKE 1 TABLET (12.5MG  TOTAL) BY MOUTH TWICE A DAY WITH MEALS, Disp: 180 tablet, Rfl: 1   ramipril  (ALTACE ) 5 MG capsule, TAKE 1 CAPSULE BY MOUTH EVERY DAY, Disp: 90 capsule, Rfl: 1

## 2024-11-05 ENCOUNTER — Ambulatory Visit: Payer: Self-pay | Admitting: Cardiology

## 2024-11-05 LAB — CUP PACEART REMOTE DEVICE CHECK
Battery Remaining Longevity: 106 mo
Battery Voltage: 3.02 V
Brady Statistic AP VP Percent: 18.51 %
Brady Statistic AP VS Percent: 0 %
Brady Statistic AS VP Percent: 81.38 %
Brady Statistic AS VS Percent: 0.1 %
Brady Statistic RA Percent Paced: 18.56 %
Brady Statistic RV Percent Paced: 99.9 %
Date Time Interrogation Session: 20260108042601
Implantable Lead Connection Status: 753985
Implantable Lead Connection Status: 753985
Implantable Lead Implant Date: 20100701
Implantable Lead Implant Date: 20100701
Implantable Lead Location: 753859
Implantable Lead Location: 753860
Implantable Lead Model: 5076
Implantable Lead Model: 5076
Implantable Pulse Generator Implant Date: 20230410
Lead Channel Impedance Value: 323 Ohm
Lead Channel Impedance Value: 399 Ohm
Lead Channel Impedance Value: 456 Ohm
Lead Channel Impedance Value: 532 Ohm
Lead Channel Pacing Threshold Amplitude: 0.5 V
Lead Channel Pacing Threshold Amplitude: 1.125 V
Lead Channel Pacing Threshold Pulse Width: 0.4 ms
Lead Channel Pacing Threshold Pulse Width: 0.4 ms
Lead Channel Sensing Intrinsic Amplitude: 0.75 mV
Lead Channel Sensing Intrinsic Amplitude: 0.75 mV
Lead Channel Setting Pacing Amplitude: 1.5 V
Lead Channel Setting Pacing Amplitude: 2.25 V
Lead Channel Setting Pacing Pulse Width: 0.4 ms
Lead Channel Setting Sensing Sensitivity: 4 mV
Zone Setting Status: 755011
Zone Setting Status: 755011

## 2024-11-05 NOTE — Telephone Encounter (Signed)
 Patient is schedule for Medicare Annual OV 09/28/25

## 2024-11-05 NOTE — Telephone Encounter (Signed)
 Please see provider response and call patient to advise on appointment she is requesting. Welcome to medicare was completed in November and nothing further is needed until November this year, unless she needs something before then and this would be a acute or office visit.

## 2024-11-09 NOTE — Progress Notes (Signed)
 Remote PPM Transmission

## 2024-12-02 ENCOUNTER — Telehealth: Payer: Self-pay | Admitting: Cardiology

## 2024-12-02 ENCOUNTER — Encounter: Payer: Self-pay | Admitting: Gastroenterology

## 2024-12-02 ENCOUNTER — Other Ambulatory Visit: Payer: Self-pay

## 2024-12-02 MED ORDER — CARVEDILOL 12.5 MG PO TABS: 12.5000 mg | ORAL_TABLET | Freq: Two times a day (BID) | ORAL | 0 refills | Status: AC

## 2024-12-02 MED ORDER — CARVEDILOL 12.5 MG PO TABS: 12.5000 mg | ORAL_TABLET | Freq: Two times a day (BID) | ORAL | 0 refills | Status: DC

## 2024-12-02 NOTE — Telephone Encounter (Signed)
 Pt scheduled 12/09/24 with Dr. Inocencio, refill sent.

## 2024-12-02 NOTE — Telephone Encounter (Signed)
" °*  STAT* If patient is at the pharmacy, call can be transferred to refill team.   1. Which medications need to be refilled? (please list name of each medication and dose if known) carvedilol  (COREG ) 12.5 MG tablet    2. Would you like to learn more about the convenience, safety, & potential cost savings by using the [facility]?    3. Are you open to using the [facility] (Type [facility].    4. Which pharmacy/location (including street and city if local pharmacy) is medication to be sent to? [id number] [place], [place] - [address]     5. Do they need a 30 day or 90 day supply? 90   Patient is out of meds "

## 2024-12-06 ENCOUNTER — Ambulatory Visit (INDEPENDENT_AMBULATORY_CARE_PROVIDER_SITE_OTHER): Admitting: Physician Assistant

## 2024-12-08 ENCOUNTER — Ambulatory Visit: Admitting: Cardiology

## 2024-12-28 ENCOUNTER — Ambulatory Visit: Admitting: Gastroenterology

## 2025-02-03 ENCOUNTER — Encounter

## 2025-05-05 ENCOUNTER — Encounter

## 2025-08-04 ENCOUNTER — Encounter

## 2025-09-28 ENCOUNTER — Ambulatory Visit: Admitting: Physician Assistant
